# Patient Record
Sex: Male | Born: 1980
Health system: Southern US, Community
[De-identification: ages and names within clinical notes are randomized; demographics above are authoritative.]

## PROBLEM LIST (undated history)

## (undated) DIAGNOSIS — M545 Low back pain, unspecified: Secondary | ICD-10-CM

## (undated) HISTORY — PX: SPINE SURGERY: SHX786

## (undated) HISTORY — PX: BACK SURGERY: SHX140

---

## 2005-04-16 ENCOUNTER — Ambulatory Visit (HOSPITAL_COMMUNITY): Admission: RE | Admit: 2005-04-16 | Discharge: 2005-04-16 | Payer: Self-pay | Admitting: *Deleted

## 2015-04-18 ENCOUNTER — Encounter (HOSPITAL_COMMUNITY): Payer: Self-pay | Admitting: *Deleted

## 2015-04-18 ENCOUNTER — Emergency Department (HOSPITAL_COMMUNITY)
Admission: EM | Admit: 2015-04-18 | Discharge: 2015-04-18 | Disposition: A | Discharge status: Home / Self Care | Payer: 59 | Attending: Emergency Medicine | Admitting: Emergency Medicine

## 2015-04-18 ENCOUNTER — Emergency Department (HOSPITAL_COMMUNITY): Payer: 59

## 2015-04-18 DIAGNOSIS — G8929 Other chronic pain: Secondary | ICD-10-CM | POA: Diagnosis not present

## 2015-04-18 DIAGNOSIS — Y999 Unspecified external cause status: Secondary | ICD-10-CM | POA: Diagnosis not present

## 2015-04-18 DIAGNOSIS — Z9889 Other specified postprocedural states: Secondary | ICD-10-CM | POA: Diagnosis not present

## 2015-04-18 DIAGNOSIS — S3992XA Unspecified injury of lower back, initial encounter: Secondary | ICD-10-CM | POA: Diagnosis not present

## 2015-04-18 DIAGNOSIS — Y9389 Activity, other specified: Secondary | ICD-10-CM | POA: Insufficient documentation

## 2015-04-18 DIAGNOSIS — Y929 Unspecified place or not applicable: Secondary | ICD-10-CM | POA: Insufficient documentation

## 2015-04-18 DIAGNOSIS — X58XXXA Exposure to other specified factors, initial encounter: Secondary | ICD-10-CM | POA: Insufficient documentation

## 2015-04-18 DIAGNOSIS — M545 Low back pain, unspecified: Secondary | ICD-10-CM

## 2015-04-18 HISTORY — DX: Low back pain, unspecified: M54.50

## 2015-04-18 HISTORY — DX: Low back pain: M54.5

## 2015-04-18 MED ORDER — METHOCARBAMOL 500 MG PO TABS
1000.0000 mg | ORAL_TABLET | Freq: Once | ORAL | Status: AC
  Administered 2015-04-18: 1000 mg via ORAL
  Filled 2015-04-18: qty 2

## 2015-04-18 MED ORDER — OXYCODONE-ACETAMINOPHEN 5-325 MG PO TABS: ORAL_TABLET | ORAL | Status: DC

## 2015-04-18 MED ORDER — OXYCODONE-ACETAMINOPHEN 5-325 MG PO TABS
1.0000 | ORAL_TABLET | Freq: Once | ORAL | Status: AC
Start: 2015-04-18 — End: 2015-04-18
  Administered 2015-04-18: 1 via ORAL
  Filled 2015-04-18: qty 1

## 2015-04-18 MED ORDER — NAPROXEN 250 MG PO TABS: 250.0000 mg | ORAL_TABLET | Freq: Two times a day (BID) | ORAL | Status: DC | PRN

## 2015-04-18 MED ORDER — METHOCARBAMOL 500 MG PO TABS: 1000.0000 mg | ORAL_TABLET | Freq: Four times a day (QID) | ORAL | Status: DC | PRN

## 2015-04-18 MED ORDER — IBUPROFEN 200 MG PO TABS
400.0000 mg | ORAL_TABLET | Freq: Once | ORAL | Status: AC
  Administered 2015-04-18: 400 mg via ORAL
  Filled 2015-04-18: qty 2

## 2015-04-18 NOTE — ED Notes (Signed)
Bed: WA24 Expected date:  Expected time:  Means of arrival:  Comments: EMS- back pain 

## 2015-04-18 NOTE — Discharge Instructions (Signed)
°Emergency Department Resource Guide °1) Find a Doctor and Pay Out of Pocket °Although you won't have to find out who is covered by your insurance plan, it is a good idea to ask around and get recommendations. You will then need to call the office and see if the doctor you have chosen will accept you as a new patient and what types of options they offer for patients who are self-pay. Some doctors offer discounts or will set up payment plans for their patients who do not have insurance, but you will need to ask so you aren't surprised when you get to your appointment. ° °2) Contact Your Local Health Department °Not all health departments have doctors that can see patients for sick visits, but many do, so it is worth a call to see if yours does. If you don't know where your local health department is, you can check in your phone book. The CDC also has a tool to help you locate your state's health department, and many state websites also have listings of all of their local health departments. ° °3) Find a Walk-in Clinic °If your illness is not likely to be very severe or complicated, you may want to try a walk in clinic. These are popping up all over the country in pharmacies, drugstores, and shopping centers. They're usually staffed by nurse practitioners or physician assistants that have been trained to treat common illnesses and complaints. They're usually fairly quick and inexpensive. However, if you have serious medical issues or chronic medical problems, these are probably not your best option. ° °No Primary Care Doctor: °- Call [facility] at  832-8000 - they can help you locate a primary care doctor that  accepts your insurance, provides certain services, etc. °- Physician Referral Service- 1-800-533-3463 ° °Chronic Pain Problems: °Organization         Address  Phone   Notes  °[facility]  (336) 297-2271 Patients need to be referred by their primary care doctor.  ° °Medication  Assistance: °Organization         Address  Phone   Notes  °[place] [facility] [address] °[place], [place] [address] (336) 641-8030 --Must be a resident of [place] °-- Must have NO insurance coverage whatsoever (no Medicaid/ Medicare, etc.) °-- The pt. MUST have a primary care doctor that directs their care regularly and follows them in the community °  °[facility]  (866) 331-1348   °[employer]  (888) 892-1162   ° °Agencies that provide inexpensive medical care: °Organization         Address  Phone   Notes  °[facility]  (336) 832-8035   °[facility]    (336) 832-7272   °[facility] [address] °[place],  [address] (336) 832-4777   °[facility] [address], °[place] (336) 271-4999   °[facility]    (336) 373-0678   °[facility]    (336) 272-1050   °[facility] ° [address], [place] Phone:  (336) 832-4444, Fax:  (336) 832-4440 Hours of Operation:  9 am - 6 pm, M-F.  Also accepts Medicaid/Medicare and self-pay.  °[facility] ° [address], [place] Phone: (336) 832-3150, Fax: (336) 832-3151. Hours of Operation:  8:30 am - 5:30 pm, M-F.  Also accepts Medicaid and self-pay.  °[facility] [place] [address], [place] Phone: (336) 878-6027   °[facility] [address], [place], [place] (336)723-1848, Ext. 123 Mondays & Thursdays: 7-9 AM.  First 15 patients are seen on a first come, first serve basis. °  ° °Medicaid-accepting [place] Providers: ° °Organization         Address  Phone   Notes  °[facility] [address], [place] (336) 641-2100 Also accepts self-pay patients.  °[facility] [address], [place] ° (336) 856-9996   °[facility] [address], [place]  (336) 288-8857   °[facility] [address] [place] [address], [place] (336) 299-7000   °Veita Bland [address], [place]  ° (336) 373-1557 Only accepts [place] Access Medicaid patients after they have their name applied to their card.  ° °Self-Pay (no insurance) in [place]: ° °Organization         Address  Phone   Notes  °Sickle Cell Patients, [facility] [address], [place] (336) 832-1970   °[facility] [address], [place] (336) 832-4400   °[facility] ° [address], [place] (336) 992-4800   °[facility]/Dr. Osei-Bonsu ° [address] [place] [address], [place] or [address], [place] (336) 841-8500 Phone number for both [place] and [place] locations is the same.  °[facility] [address], [place] (336) 299-0000   °[facility] [address] [place] [address], [place] or [address] (336) 852-7530 °(336) 878-2260   °[facility] [address], [place] (336) 350-1642, phone; (336) 294-5005, fax Sees patients 1st and 3rd Saturday of every month.  Must not qualify for public or private insurance (i.e. Medicaid, Medicare, [place] Health Choice, Veterans' Benefits) • Household income should be no more than 200% of the poverty level •The clinic cannot treat you if you are pregnant or think you are pregnant • Sexually transmitted diseases are not treated at the clinic.  ° ° °Dental Care: °Organization         Address  Phone  Notes  °[place] [facility] [address], [place] (336) 641-6152 Accepts children up to age 21 who are enrolled in Medicaid or [place] Health Choice; pregnant women with a Medicaid card; and children who have applied for Medicaid or [place] Health Choice, but were declined, whose parents can pay a reduced fee at time of service.  °[place]  [facility] [place]  [address], [place] (336) 641-7733 Accepts children up to age 21 who are enrolled in Medicaid or [place] Health Choice; pregnant women with a Medicaid card; and children who have applied for Medicaid or [place] Health Choice, but were declined, whose parents can pay a reduced fee at time of service.  °[facility] ° [address],  (336) 641-4533 Patients are seen by appointment only. Walk-ins are not accepted. [facility] will see patients 18 years of age and older. °Monday - Tuesday (8am-5pm) °Most Wednesdays (8:30-5pm) °$30 per visit, cash only  °[facility] ° [address], [place] (336) 641-4533 Patients are seen by appointment only. Walk-ins are not accepted. [facility] will see patients 18 years of age and older. °One   Wednesday Evening (Monthly: Volunteer Based).  $30 per visit, cash only  °[facility]  (919) 537-3737 for adults; Children under age 4, call Graduate Pediatric Dentistry at (919) [phone]. Children aged 4-14, please call (919) 537-3737 to request a pediatric application. ° Dental services are provided in all areas of dental care including fillings, crowns and bridges, complete and partial dentures, implants, gum treatment, root canals, and extractions. Preventive care is also provided. Treatment is provided to both adults and children. °Patients are selected via a lottery and there is often a waiting list. °  °[facility] [address], °[place] ° (336) 763-8833 [email or web address] °  °[facility] [address], [place], [place] (336)723-1848, Ext. 123 Second and Fourth Thursday of each month, opens at 6:30 AM; Clinic ends at 9 AM.  Patients are seen on a first-come first-served basis, and a limited number are seen during each clinic.  ° °[facility] ° [address], [place], [place] (336) 723-7904    Eligibility Requirements °You must have lived in [place], [place], or [place] counties for at least the last three months. °  You cannot be eligible for state or federal sponsored healthcare insurance, including Veterans Administration, Medicaid, or Medicare. °  You generally cannot be eligible for healthcare insurance through your employer.  °  How to apply: °Eligibility screenings are held every Tuesday and Wednesday afternoon from 1:00 pm until 4:00 pm. You do not need an appointment for the interview!  °[facility] [address], [place], [place] 336-631-2330   °[facility]  336-342-8273   °[facility]  336-703-3100   °[facility]  336-570-6415   ° °Behavioral Health Resources in the Community: °Intensive Outpatient Programs °Organization         Address  Phone  Notes  °[facility] [address], [place], [place] 336-878-6098   °[facility] [address], [place], [place] 336-832-9800   °[facility] [address], [place], [place] ° 336-882-2125   °[facility] [address],  °[place], [place] 1-800-853-5163 or 336-641-4981   °Substance Abuse Resources °Organization         Address  Phone  Notes  °[facility]  336-882-2125   °[facility]  336-784-9470   °[facility]  336-285-9073   °[facility]  336-845-3988   °Residential & Outpatient Substance Abuse Program  1-800-659-3381   °Psychological Services °Organization         Address  Phone  Notes  °[facility]  336- 832-9600   °[facility]  336- 378-7881   °[facility] [address], [place] 1-800-853-5163 or 336-641-4981   ° °Mobile Crisis Teams °Organization         Address  Phone  Notes  °[facility], Mobile Crisis Care Unit  1-877-626-1772   °Assertive °[facility] ° [address]  [place], [place] 336-834-9664   °Sharon DeEsch [address] °[place] 336-554-5454   ° °Self-Help/Support Groups °Organization         Address  Phone             Notes  °[facility]  - variety of support groups  336- 373-1402 Call for more information  °Narcotics Anonymous (NA), [facility] [address], °[place]   2 meetings at this location  ° °  Residential Treatment Programs °Organization         Address  Phone  Notes  °[facility] [address],    °[place]  1-866-801-8205   °[facility] ° [address], [place], [place] 704-293-8524   °[facility] [address], [place] 336-845-3988 Admissions: 8am-3pm M-F  °[facility] [address]    °[place], [place] 336-841-1104   °[facility] [address], [place], [place] 336-379-7146   °[facility] [address]  °[place], [place] 336-285-9073   °[facility] - Intensive Outpatient [address], [place], [place] 336-852-3033   °[facility] [address]  °[place], [place] 1-877-615-2722 or 336-784-9470   °[facility] ([facility] [address] [place], [place] 336-227-7417 Accepts Medicaid  °[facility] [address]  °[place] 1-800-659-3381 Substance Abuse/Addiction Treatment  ° °[place] Behavioral Health Resources °Organization         Address  Phone  Notes  °[facility]  (888) 581-9988   °Julie Brannon, PhD [address] [place], [place]   (336) 349-5553 or (336) 951-0000   °[facility]   [address] °[place], [place] (336) 349-4454   °[facility] [address], [place], [place] (336) 342-8316 Insurance/Medicaid/sponsorship through [facility]  °[facility] [address]                                    [place], [place] (336) 342-8316 Therapy/tele-psych/case    °[facility] [address]  ° [place], [place] (336) 349-2233    °Dr. Arfeen  (336) 349-4544   °[facility] [place]  [employer] [place] [facility] 1) [address],  °2) [address], [place] °3)  [address], [place] (336) 349-3220 °(336) 342-7768 ° °(336) 342-8140   °[place] [facility] (336) 342-1394 or (336) 342-3537 (After Hours)    ° ° °Take the prescriptions as directed.  Apply moist heat or ice to the area(s) of discomfort, for 15 minutes at a time, several times per day for the next few days.  Do not fall asleep on a heating or ice pack.  Call your regular medical doctor today to schedule a follow up appointment in the next 2 days.  Return to the Emergency Department immediately if worsening. ° °

## 2015-04-18 NOTE — ED Notes (Signed)
Patient is alert and oriented x3.  He is complaining of lower back pain with worse pain on the left side. Patient states that he was lifting weights last night when he felt a pop in his lower back.  Patient has a  History of back surgery.  Currently he rates his pain 8 of 10.

## 2015-04-18 NOTE — ED Notes (Signed)
Pt reports slight relief with pain medication; "feels like it is starting to kick in; moving around is easier."

## 2015-04-18 NOTE — ED Notes (Signed)
MD at bedside. 

## 2015-04-18 NOTE — ED Provider Notes (Signed)
CSN: [id number]     Arrival date & time 04/18/15  0704 History   First MD Initiated Contact with Patient 04/18/15 [phone]     Chief Complaint  Patient presents with  . Back Pain      HPI  Pt was seen at 0725. Per pt, c/o gradual onset and persistence of constant acute flair of his chronic low back "pain" since last night.  Pt states he was lifting weights last night when he "felt a pop" in his lower back. Describes the pain as "aching," "sharp" and "shooting." Pain worsens with palpation of the area and body position changes. Denies incont/retention of bowel or bladder, no saddle anesthesia, no focal motor weakness, no tingling/numbness in extremities, no fevers, no direct injury, no abd pain.   The symptoms have been associated with no other complaints. The patient has a significant history of similar symptoms previously.    Past Medical History  Diagnosis Date  . Low back pain      Past Surgical History  Procedure Laterality Date  . Back surgery      History  Substance Use Topics  . Smoking status: Never Smoker   . Smokeless tobacco: Not on file  . Alcohol Use: Yes    Review of Systems ROS: Statement: All systems negative except as marked or noted in the HPI; Constitutional: Negative for fever and chills. ; ; Eyes: Negative for eye pain, redness and discharge. ; ; ENMT: Negative for ear pain, hoarseness, nasal congestion, sinus pressure and sore throat. ; ; Cardiovascular: Negative for chest pain, palpitations, diaphoresis, dyspnea and peripheral edema. ; ; Respiratory: Negative for cough, wheezing and stridor. ; ; Gastrointestinal: Negative for nausea, vomiting, diarrhea, abdominal pain, blood in stool, hematemesis, jaundice and rectal bleeding. . ; ; Genitourinary: Negative for dysuria, flank pain and hematuria. ; ; Musculoskeletal: +LBP. Negative for neck pain. Negative for swelling and direct trauma.; ; Skin: Negative for pruritus, rash, abrasions, blisters, bruising and skin  lesion.; ; Neuro: Negative for headache, lightheadedness and neck stiffness. Negative for weakness, altered level of consciousness , altered mental status, extremity weakness, paresthesias, involuntary movement, seizure and syncope.      Allergies  Review of patient's allergies indicates no known allergies.  Home Medications   Prior to Admission medications   Not on File   BP 149/90 mmHg  Pulse 102  Temp(Src) 97.9 F (36.6 C) (Oral)  Resp 20  Ht 5\' 11"  (1.803 m)  Wt 212 lb (96.163 kg)  BMI 29.58 kg/m2  SpO2 98% Physical Exam  0730: Physical examination:  Nursing notes reviewed; Vital signs and O2 SAT reviewed;  Constitutional: Well developed, Well nourished, Well hydrated, In no acute distress; Head:  Normocephalic, atraumatic; Eyes: EOMI, PERRL, No scleral icterus; ENMT: Mouth and pharynx normal, Mucous membranes moist; Neck: Supple, Full range of motion, No lymphadenopathy; Cardiovascular: Regular rate and rhythm, No gallop; Respiratory: Breath sounds clear & equal bilaterally, No wheezes.  Speaking full sentences with ease, Normal respiratory effort/excursion; Chest: Nontender, Movement normal; Abdomen: Soft, Nontender, Nondistended, Normal bowel sounds; Genitourinary: No CVA tenderness; Spine:  No midline CS, TS, LS tenderness. +TTP left > right lumbar paraspinal muscles..;; Extremities: Pulses normal, Pelvis stable. No tenderness, No edema, No calf edema or asymmetry.; Neuro: AA&Ox3, Major CN grossly intact.  Speech clear. No gross focal motor or sensory deficits in extremities. Strength 5/5 equal bilat UE's and LE's, including great toe dorsiflexion.  DTR 2/4 equal bilat UE's and LE's.  No gross sensory deficits.  Neg straight leg raises bilat. Climbs on and off stretcher easily by himself. Gait steady.; Skin: Color normal, Warm, Dry.   ED Course  Procedures     EKG Interpretation None      MDM  MDM Reviewed: previous chart, nursing note and vitals Reviewed previous:  MRI Interpretation: x-ray     Dg Lumbar Spine Complete 04/18/2015   CLINICAL DATA:  Felt a pop in lower back yesterday while lifting weights, increasing moderate to severe mid low back pain  EXAM: LUMBAR SPINE - COMPLETE 4+ VIEW  COMPARISON:  04/16/2005  FINDINGS: Five non-rib-bearing lumbar vertebra.  Prior posterior L4-L5 fusion and disc space narrowing.  Hardware appears intact without surrounding osseous lucency.  Osseous mineralization normal.  Vertebral body and disc space heights otherwise maintained.  No acute fracture, subluxation or bone destruction.  No spondylolysis.  SI joints symmetric.  IMPRESSION: Interval L4-L5 posterior fusion.  No acute abnormalities.   Electronically Signed   By: Lavonia Dana M.D.   On: 04/18/2015 08:01    0815:  Pt has been ambulatory with steady gait. States he feels better after meds and wants to go home now. Dx and testing d/w pt and friend.  Questions answered.  Verb understanding, agreeable to d/c home with outpt f/u.    Francine Graven, DO 04/19/15 1147

## 2016-08-26 DIAGNOSIS — H5213 Myopia, bilateral: Secondary | ICD-10-CM | POA: Diagnosis not present

## 2016-10-29 IMAGING — CR DG LUMBAR SPINE COMPLETE 4+V
5 series · 5 of 5 positions shown · non-contrast
Comparison: 04/16/2005

CLINICAL DATA: Felt a pop in lower back yesterday while lifting
weights, increasing moderate to severe mid low back pain

EXAM:
LUMBAR SPINE - COMPLETE 4+ VIEW

[t lumbar spine ap]
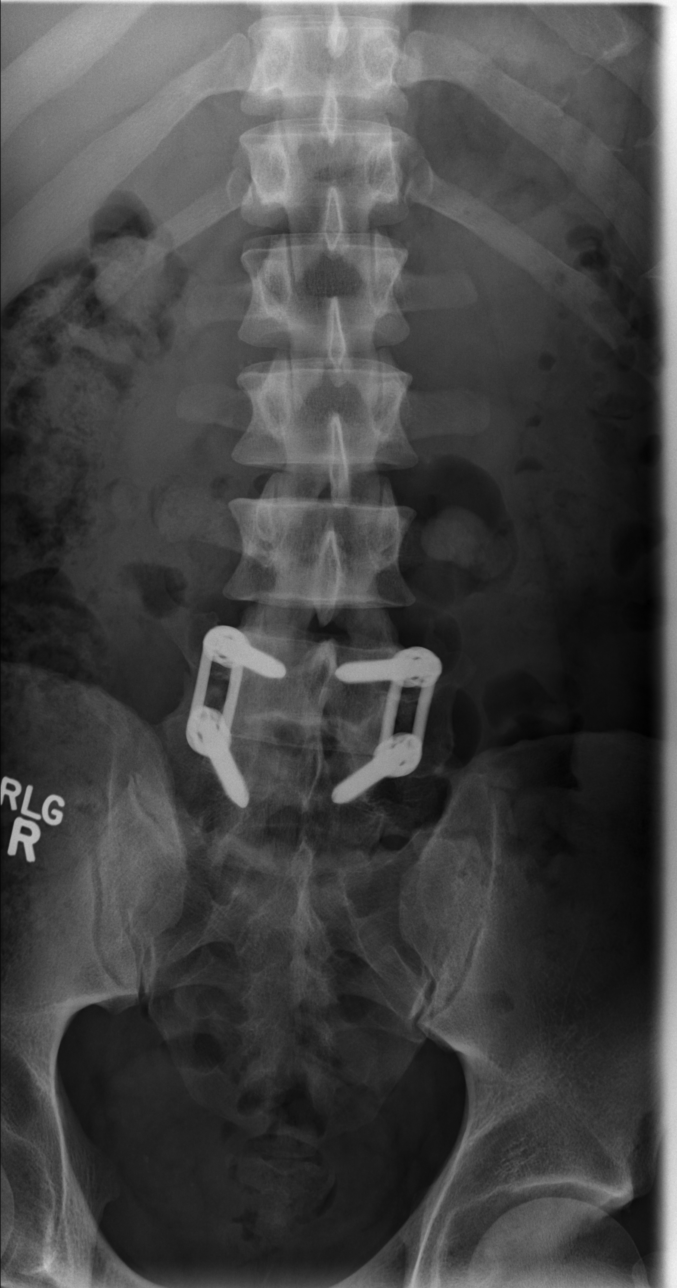

[t lumbar spine obl (1 of 2)]
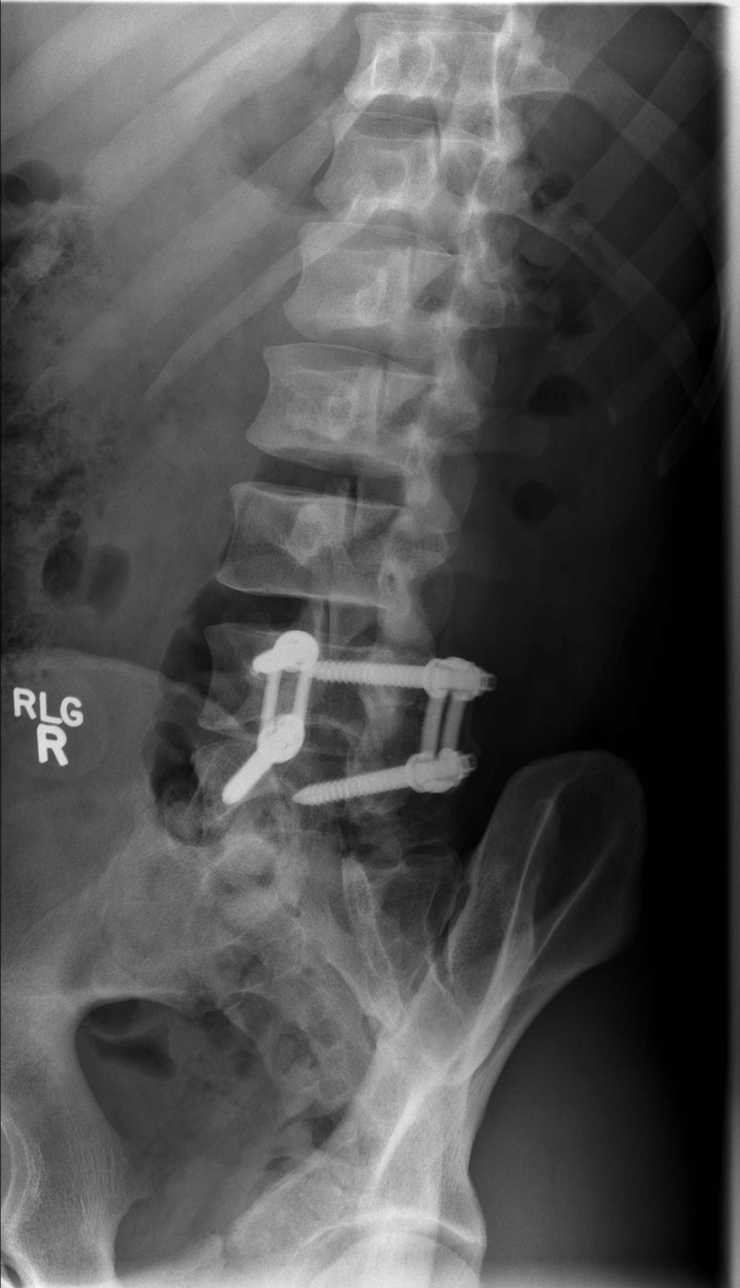

[t lumbar spine obl (2 of 2)]
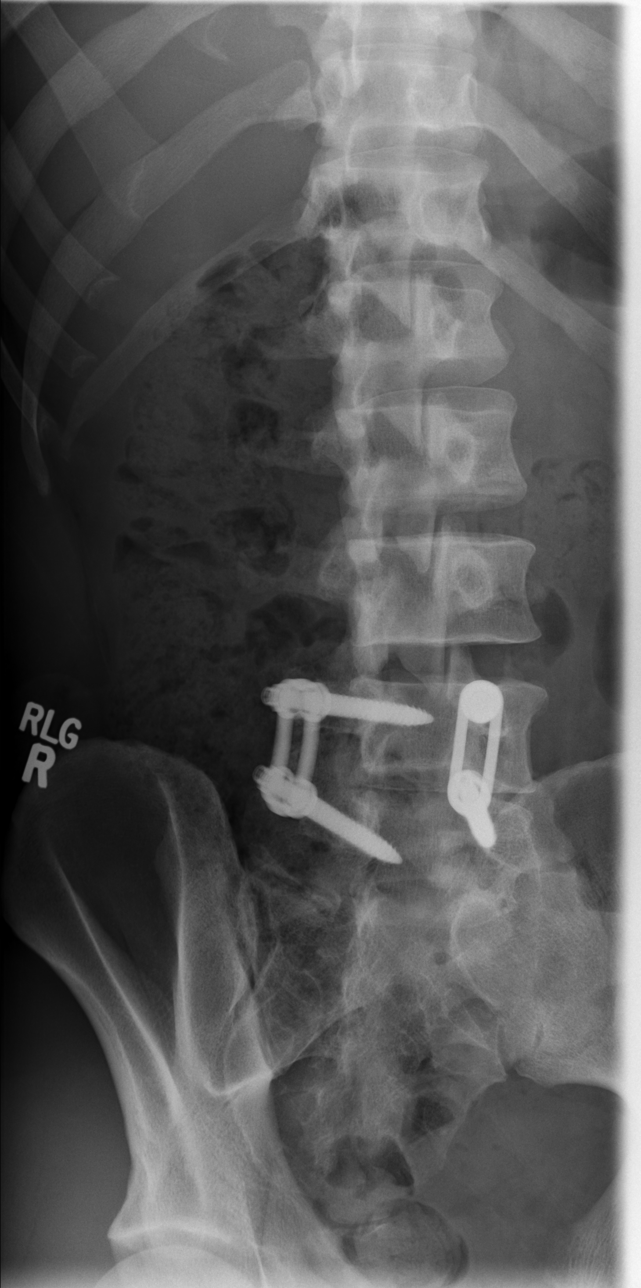

[t lumbar spine lat]
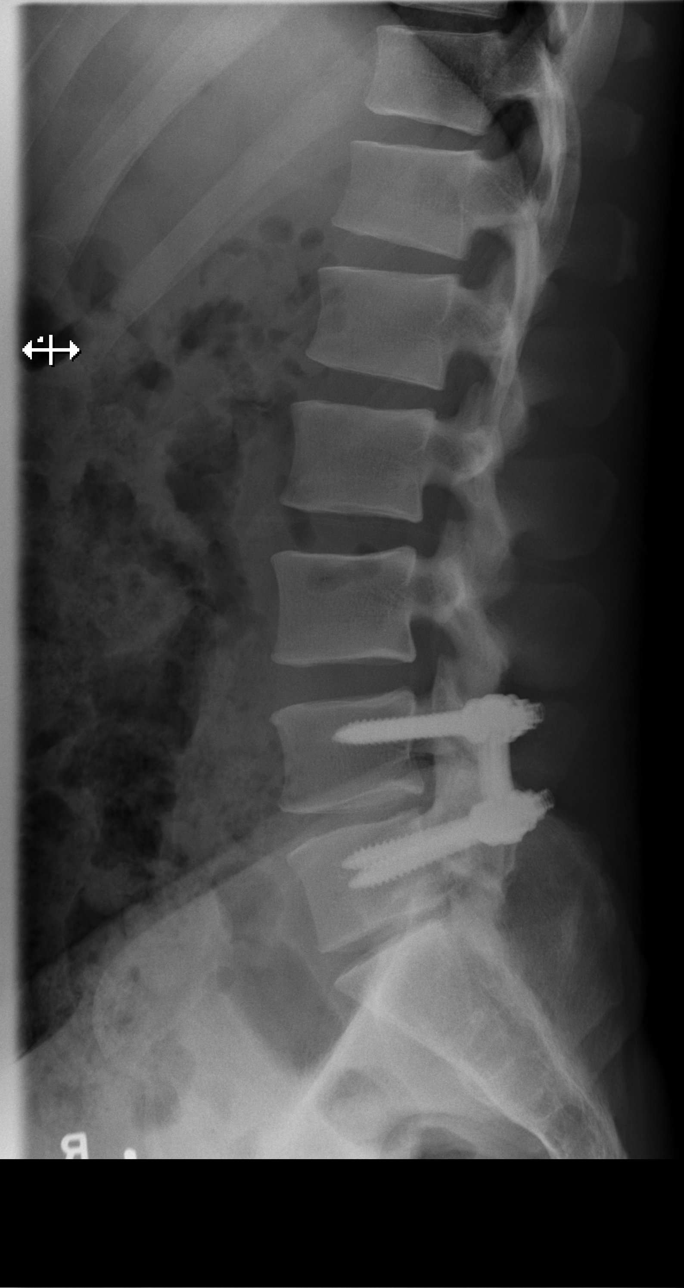

[t lumbar l-5 s-1 spot]
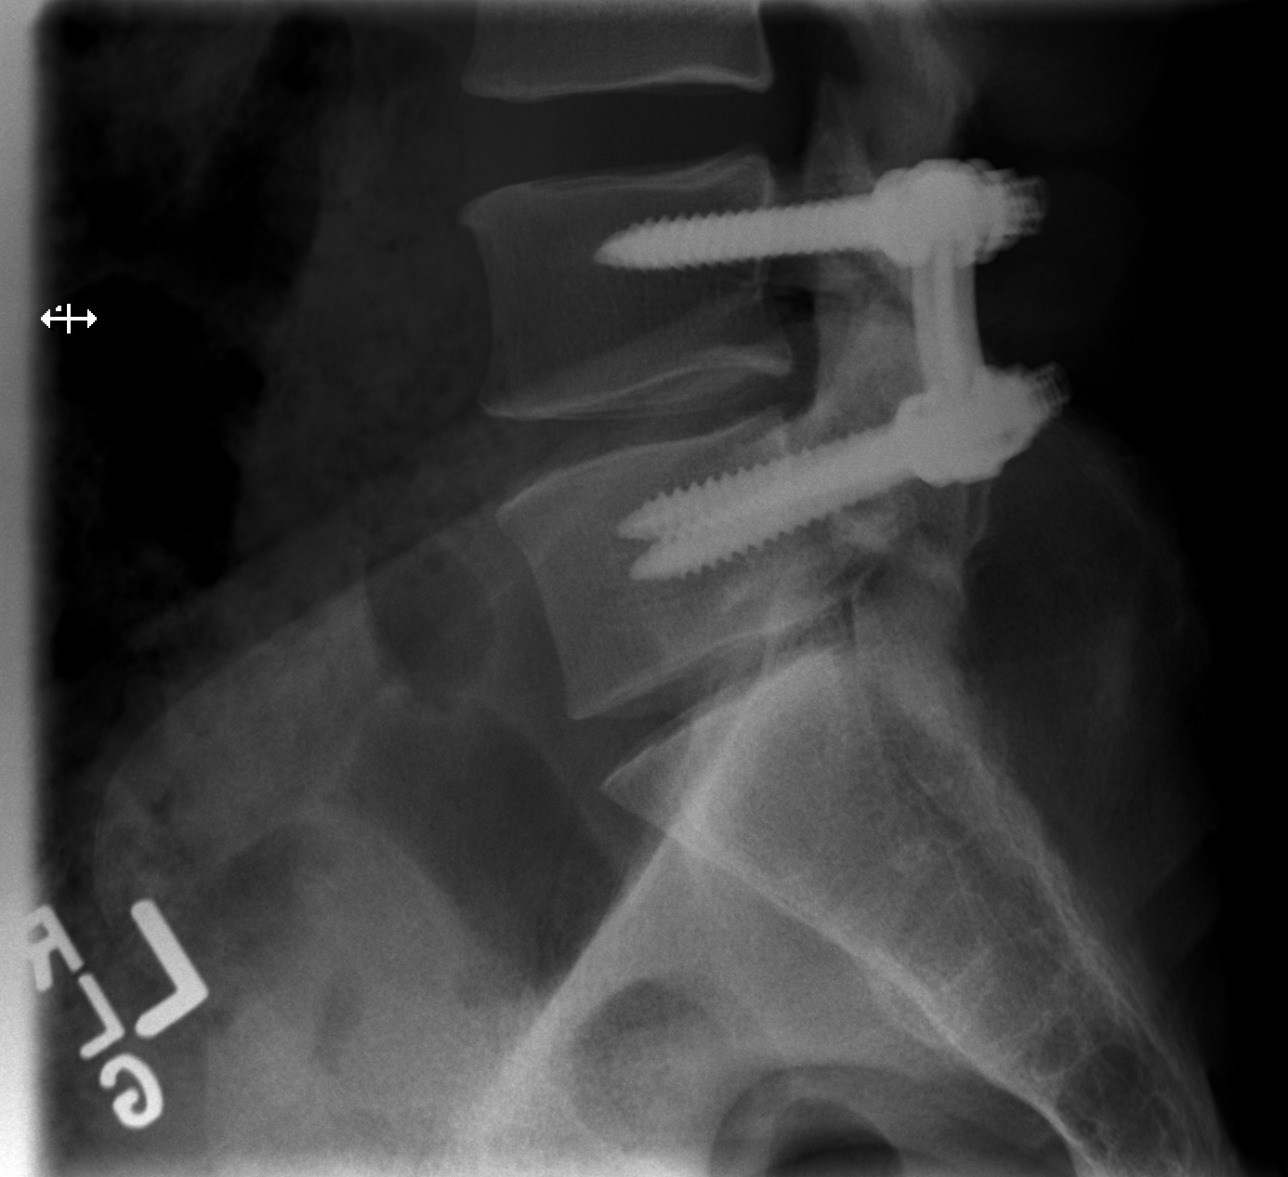

[5 of 5 positions shown; findings below may reference images not displayed]

FINDINGS: Five non-rib-bearing lumbar vertebra.

Prior posterior L4-L5 fusion and disc space narrowing.

Hardware appears intact without surrounding osseous lucency.

Osseous mineralization normal.

Vertebral body and disc space heights otherwise maintained.

No acute fracture, subluxation or bone destruction.

No spondylolysis.

SI joints symmetric.
IMPRESSION: Interval L4-L5 posterior fusion.

No acute abnormalities.

## 2018-02-23 DIAGNOSIS — D485 Neoplasm of uncertain behavior of skin: Secondary | ICD-10-CM | POA: Diagnosis not present

## 2018-02-23 DIAGNOSIS — D2272 Melanocytic nevi of left lower limb, including hip: Secondary | ICD-10-CM | POA: Diagnosis not present

## 2018-07-22 DIAGNOSIS — H5213 Myopia, bilateral: Secondary | ICD-10-CM | POA: Diagnosis not present

## 2019-01-08 DIAGNOSIS — J069 Acute upper respiratory infection, unspecified: Secondary | ICD-10-CM | POA: Diagnosis not present

## 2021-03-18 DIAGNOSIS — Z3009 Encounter for other general counseling and advice on contraception: Secondary | ICD-10-CM | POA: Diagnosis not present

## 2021-05-30 DIAGNOSIS — Z302 Encounter for sterilization: Secondary | ICD-10-CM | POA: Diagnosis not present

## 2021-08-08 ENCOUNTER — Ambulatory Visit (HOSPITAL_BASED_OUTPATIENT_CLINIC_OR_DEPARTMENT_OTHER): Payer: 59 | Admitting: Nurse Practitioner

## 2021-08-08 ENCOUNTER — Encounter (HOSPITAL_BASED_OUTPATIENT_CLINIC_OR_DEPARTMENT_OTHER): Payer: Self-pay | Admitting: Nurse Practitioner

## 2021-08-08 ENCOUNTER — Other Ambulatory Visit: Payer: Self-pay

## 2021-08-08 VITALS — BP 148/93 | HR 100 | Ht 71.0 in | Wt 193.8 lb

## 2021-08-08 DIAGNOSIS — Z1321 Encounter for screening for nutritional disorder: Secondary | ICD-10-CM

## 2021-08-08 DIAGNOSIS — Z13220 Encounter for screening for lipoid disorders: Secondary | ICD-10-CM | POA: Diagnosis not present

## 2021-08-08 DIAGNOSIS — Z130 Encounter for screening for diseases of the blood and blood-forming organs and certain disorders involving the immune mechanism: Secondary | ICD-10-CM | POA: Diagnosis not present

## 2021-08-08 DIAGNOSIS — Z1329 Encounter for screening for other suspected endocrine disorder: Secondary | ICD-10-CM | POA: Diagnosis not present

## 2021-08-08 DIAGNOSIS — Z0000 Encounter for general adult medical examination without abnormal findings: Secondary | ICD-10-CM | POA: Diagnosis not present

## 2021-08-08 DIAGNOSIS — Z13228 Encounter for screening for other metabolic disorders: Secondary | ICD-10-CM

## 2021-08-08 NOTE — Progress Notes (Signed)
Douglas Render, DNP, AGNP-c Primary Care & Sports Medicine [address]  [place], [place] [address] [phone]  New patient visit   Patient: Douglas Sanford   DOB: March 16, 1981   40 y.o. Male  MRN: [id number] Visit Date: 08/08/2021  Patient Care Team: Douglas Sanford, Douglas Pesa, NP as PCP - General (Nurse Practitioner)  Today's healthcare provider: Orma Render, NP   Chief Complaint  Patient presents with   [name] is a 40 y.o. male who presents today as a new patient to establish care.  HPI   Douglas Sanford reports no significant medical history and no concerns today.  He has not seen a PCP in several years or had labs.  He works full time in [place] at [employer] and enjoys his work.  He is married with two young children at home.   He has a history of spinal fusion at 20y and again at [place]. He reports that he has learned what he can and cannot do to help protect his spine and prevent pain.  He has no concerns today.   Past Medical History:  Diagnosis Date   Low back pain    Past Surgical History:  Procedure Laterality Date   BACK SURGERY     Family Status  Relation Name Status   Mother  Alive   Father  Alive   History reviewed. No pertinent family history. Social History   Socioeconomic History   Marital status: Married    Spouse name: Not on file   Number of children: 2   Years of education: Not on file   Highest education level: Not on file  Occupational History   Not on file  Tobacco Use   Smoking status: Never   Smokeless tobacco: Never  Vaping Use   Vaping Use: Never used  Substance and Sexual Activity   Alcohol use: Not Currently   Drug use: Never   Sexual activity: Not on file  Other Topics Concern   Not on file  Social History Narrative   60 month old son and 41 year old daughter.    Social Determinants of Health   Financial Resource Strain: Not on file  Food Insecurity: Not on file   Transportation Needs: Not on file  Physical Activity: Not on file  Stress: Not on file  Social Connections: Not on file   Outpatient Medications Prior to Visit  Medication Sig   [DISCONTINUED] ibuprofen (ADVIL,MOTRIN) 200 MG tablet Take 800 mg by mouth every 6 (six) hours as needed for fever, headache, mild pain, moderate pain or cramping. (Patient not taking: Reported on 08/08/2021)   [DISCONTINUED] methocarbamol (ROBAXIN) 500 MG tablet Take 2 tablets (1,000 mg total) by mouth 4 (four) times daily as needed for muscle spasms (muscle spasm/pain). (Patient not taking: Reported on 08/08/2021)   [DISCONTINUED] naproxen (NAPROSYN) 250 MG tablet Take 1 tablet (250 mg total) by mouth 2 (two) times daily as needed for mild pain or moderate pain (take with food). (Patient not taking: Reported on 08/08/2021)   [DISCONTINUED] oxyCODONE-acetaminophen (PERCOCET/ROXICET) 5-325 MG per tablet 1 or 2 tabs PO q6h prn pain (Patient not taking: Reported on 08/08/2021)   No facility-administered medications prior to visit.   No Known Allergies  Immunization History  Administered Date(s) Administered   Influenza-Unspecified 08/06/2021    Health Maintenance  Topic Date Due   TETANUS/TDAP  Never done   INFLUENZA VACCINE  Completed  Pneumococcal Vaccine 1-20 Years old  Aged Out   HPV VACCINES  Aged Out   Hepatitis C Screening  Discontinued   HIV Screening  Discontinued    Patient Care Team: Keliah Harned, Douglas Pesa, NP as PCP - General (Nurse Practitioner)  Review of Systems All review of systems negative except what is listed in the HPI    Objective    BP (!) 148/93   Pulse 100   Ht _0  (1.803 m)   Wt 193 lb 12.8 oz (87.9 kg)   SpO2 100%   BMI 27.03 kg/m  Physical Exam Vitals and nursing note reviewed.  Constitutional:      General: He is not in acute distress.    Appearance: Normal appearance.  HENT:     Head: Normocephalic and atraumatic.     Right Ear: Hearing, tympanic membrane, ear  canal and external ear normal.     Left Ear: Hearing, tympanic membrane, ear canal and external ear normal.     Nose: Nose normal.     Right Sinus: No maxillary sinus tenderness or frontal sinus tenderness.     Left Sinus: No maxillary sinus tenderness or frontal sinus tenderness.     Mouth/Throat:     Lips: Pink.     Mouth: Mucous membranes are moist.     Pharynx: Oropharynx is clear.  Eyes:     General: Lids are normal. Vision grossly intact.     Extraocular Movements: Extraocular movements intact.     Conjunctiva/sclera: Conjunctivae normal.     Pupils: Pupils are equal, round, and reactive to light.     Funduscopic exam:    Right eye: No hemorrhage. Red reflex present.        Left eye: No hemorrhage. Red reflex present.    Visual Fields: Right eye visual fields normal and left eye visual fields normal.  Neck:     Thyroid: No thyromegaly.     Vascular: No carotid bruit or JVD.  Cardiovascular:     Rate and Rhythm: Normal rate and regular rhythm.     Chest Wall: PMI is not displaced.     Pulses: Normal pulses.          Dorsalis pedis pulses are 2+ on the right side and 2+ on the left side.       Posterior tibial pulses are 2+ on the right side and 2+ on the left side.     Heart sounds: Normal heart sounds. No murmur heard. Pulmonary:     Effort: Pulmonary effort is normal. No respiratory distress.     Breath sounds: Normal breath sounds.  Chest:  Breasts:    Breasts are symmetrical.  Abdominal:     General: Bowel sounds are normal. There is no distension or abdominal bruit.     Palpations: Abdomen is soft. There is no hepatomegaly, splenomegaly or mass.     Tenderness: There is no abdominal tenderness. There is no right CVA tenderness, left CVA tenderness, guarding or rebound.  Musculoskeletal:        General: Normal range of motion.     Cervical back: Full passive range of motion without pain and neck supple. No tenderness. No spinous process tenderness or muscular  tenderness.     Right lower leg: No edema.     Left lower leg: No edema.  Feet:     Right foot:     Toenail Condition: Right toenails are normal.     Left foot:     Toenail  Condition: Left toenails are normal.  Lymphadenopathy:     Cervical: No cervical adenopathy.     Upper Body:     Right upper body: No supraclavicular adenopathy.     Left upper body: No supraclavicular adenopathy.  Skin:    General: Skin is warm and dry.     Capillary Refill: Capillary refill takes less than 2 seconds.     Nails: There is no clubbing.  Neurological:     General: No focal deficit present.     Mental Status: He is alert and oriented to person, place, and time.     Cranial Nerves: No cranial nerve deficit.     Sensory: Sensation is intact. No sensory deficit.     Motor: Motor function is intact. No weakness.     Coordination: Coordination is intact. Coordination normal.     Gait: Gait is intact. Gait normal.  Psychiatric:        Attention and Perception: Attention normal.        Mood and Affect: Mood normal.        Speech: Speech normal.        Behavior: Behavior normal. Behavior is cooperative.        Thought Content: Thought content normal.        Cognition and Memory: Cognition and memory normal.        Judgment: Judgment normal.     Depression Screen PHQ 2/9 Scores 08/08/2021  PHQ - 2 Score 0  PHQ- 9 Score 0   Results for orders placed or performed in visit on 08/08/21  CBC With Differential  Result Value Ref Range   WBC 7.2 3.4 - 10.8 x10E3/uL   RBC 5.36 4.14 - 5.80 x10E6/uL   Hemoglobin 16.2 13.0 - 17.7 g/dL   Hematocrit 46.5 37.5 - 51.0 %   MCV 87 79 - 97 fL   MCH 30.2 26.6 - 33.0 pg   MCHC 34.8 31.5 - 35.7 g/dL   RDW 12.1 11.6 - 15.4 %   Neutrophils 70 Not Estab. %   Lymphs 21 Not Estab. %   Monocytes 7 Not Estab. %   Eos 1 Not Estab. %   Basos 1 Not Estab. %   Neutrophils Absolute 5.0 1.4 - 7.0 x10E3/uL   Lymphocytes Absolute 1.5 0.7 - 3.1 x10E3/uL   Monocytes  Absolute 0.5 0.1 - 0.9 x10E3/uL   EOS (ABSOLUTE) 0.1 0.0 - 0.4 x10E3/uL   Basophils Absolute 0.1 0.0 - 0.2 x10E3/uL   Immature Granulocytes 0 Not Estab. %   Immature Grans (Abs) 0.0 0.0 - 0.1 x10E3/uL  Comprehensive metabolic panel  Result Value Ref Range   Glucose 101 (H) 70 - 99 mg/dL   BUN 14 6 - 20 mg/dL   Creatinine, Ser 0.93 0.76 - 1.27 mg/dL   eGFR 108 >59 mL/min/1.73   BUN/Creatinine Ratio 15 9 - 20   Sodium 140 134 - 144 mmol/L   Potassium 4.4 3.5 - 5.2 mmol/L   Chloride 101 96 - 106 mmol/L   CO2 24 20 - 29 mmol/L   Calcium 10.0 8.7 - 10.2 mg/dL   Total Protein 7.4 6.0 - 8.5 g/dL   Albumin 5.0 4.0 - 5.0 g/dL   Globulin, Total 2.4 1.5 - 4.5 g/dL   Albumin/Globulin Ratio 2.1 1.2 - 2.2   Bilirubin Total 0.3 0.0 - 1.2 mg/dL   Alkaline Phosphatase 63 44 - 121 IU/L   AST 17 0 - 40 IU/L   ALT 19 0 - 44 IU/L  Lipid panel  Result Value Ref Range   Cholesterol, Total 170 100 - 199 mg/dL   Triglycerides 75 0 - 149 mg/dL   HDL 68 >39 mg/dL   VLDL Cholesterol Cal 14 5 - 40 mg/dL   LDL Chol Calc (NIH) 88 0 - 99 mg/dL   Chol/HDL Ratio 2.5 0.0 - 5.0 ratio  Hemoglobin A1c  Result Value Ref Range   Hgb A1c MFr Bld 5.2 4.8 - 5.6 %   Est. average glucose Bld gHb Est-mCnc 103 mg/dL    Assessment & Plan      Problem List Items Addressed This Visit     Screening for endocrine, nutritional, metabolic and immunity disorder   Relevant Orders   Comprehensive metabolic panel (Completed)   Lipid panel (Completed)   Hemoglobin A1c (Completed)   Screening for lipid disorders   Relevant Orders   Lipid panel (Completed)   Screening for deficiency anemia - Primary   Relevant Orders   CBC With Differential (Completed)   Encounter for annual physical exam    CPE today for new patient.  No recent medical history available for review.  UTD on flu vaccine.  Unclear on tetanus- will check on that.  Plan to get labs today.  F/U in 1 year or sooner if needed based on labs         Return in about 1 year (around 08/08/2022) for CPE today- CPE in 1 year.      Nakiah Osgood, Douglas Pesa, NP, DNP, AGNP-C Primary Care & Sports Medicine at [facility]

## 2021-08-08 NOTE — Patient Instructions (Signed)
Thank you for choosing [facility] for your Primary Care needs. I am excited for the opportunity to partner with you to meet your health care goals. It was a pleasure meeting you today!  Recommendations from today's visit: We will let you know if you labs show any concerning findings.  If you have any needs, please let us know.   Information on diet, exercise, and health maintenance recommendations are listed below. This is information to help you be sure you are on track for optimal health and monitoring.   Please look over this and let us know if you have any questions or if you have completed any of the health maintenance outside of [facility] [place] so that we can be sure your records are up to date.  ___________________________________________________________ About Me: I am an Adult-Geriatric Nurse Practitioner with a background in caring for patients for more than 20 years with a strong intensive care background. I provide primary care and sports medicine services to patients age 38 and older within this office. My education had a strong focus on caring for the older adult population, which I am passionate about. I am also the director of the APP Fellowship with [facility].   My desire is to provide you with the best service through preventive medicine and supportive care. I consider you a part of the medical team and value your input. I work diligently to ensure that you are heard and your needs are met in a safe and effective manner. I want you to feel comfortable with me as your provider and want you to know that your health concerns are important to me.  For your information, our office hours are: Monday, Tuesday, and Thursday 8:00 AM - 5:00 PM Wednesday and Friday 8:00 AM - 12:00 PM.   In my time away from the office I am teaching new APP's within the system and am unavailable, but my partner, Dr. Burnard Bunting is in the office for emergent needs.   If you have  questions or concerns, please call our office at [phone] or send [place] a MyChart message and we will respond as quickly as possible.  ____________________________________________________________ MyChart:  For all urgent or time sensitive needs we ask that you please call the office to avoid delays. Our number is [phone]. MyChart is not constantly monitored and due to the large volume of messages a day, replies may take up to 72 business hours.  MyChart Policy: MyChart allows for you to see your visit notes, after visit summary, provider recommendations, lab and tests results, make an appointment, request refills, and contact your provider or the office for non-urgent questions or concerns. Providers are seeing patients during normal business hours and do not have built in time to review MyChart messages.  We ask that you allow a minimum of 3 business days for responses to [employer]. For this reason, please do not send urgent requests through [place]. Please call the office at [phone]. New and ongoing conditions may require a visit. We have virtual and in person visit available for your convenience.  Complex MyChart concerns may require a visit. Your provider may request you schedule a virtual or in person visit to ensure we are providing the best care possible. MyChart messages sent after 11:00 AM on Friday will not be received by the provider until Monday morning.    Lab and Test Results: You will receive your lab and test results on MyChart as soon as they  are completed and results have been sent by the lab or testing facility. Due to this service, you will receive your results BEFORE your provider.  I review lab and tests results each morning prior to seeing patients. Some results require collaboration with other providers to ensure you are receiving the most appropriate care. For this reason, we ask that you please allow a minimum of 3-5 business days from the time the ALL  results have been received for your provider to receive and review lab and test results and contact you about these.  Most lab and test result comments from the provider will be sent through [place]. Your provider may recommend changes to the plan of care, follow-up visits, repeat testing, ask questions, or request an office visit to discuss these results. You may reply directly to this message or call the office at [phone] to provide information for the provider or set up an appointment. In some instances, you will be called with test results and recommendations. Please let us know if this is preferred and we will make note of this in your chart to provide this for you.    If you have not heard a response to your lab or test results in 5 business days from all results returning to [place], please call the office to let us know. We ask that you please avoid calling prior to this time unless there is an emergent concern. Due to high call volumes, this can delay the resulting process.  After Hours: For all non-emergency after hours needs, please call the office at [phone] and select the option to reach the on-call provider service. On-call services are shared between multiple [place] offices and therefore it will not be possible to speak directly with your provider. On-call providers may provide medical advice and recommendations, but are unable to provide refills for maintenance medications.  For all emergency or urgent medical needs after normal business hours, we recommend that you seek care at the closest Urgent Care or Emergency Department to ensure appropriate treatment in a timely manner.  [facility] has a 24 hour emergency room located on the ground floor for your convenience.   Urgent Concerns During the Business Day Providers are seeing patients from 8AM to [place] with a busy schedule and are most often not able to respond to non-urgent calls until the end of the  day or the next business day. If you should have URGENT concerns during the day, please call and speak to the nurse or schedule a same day appointment so that we can address your concern without delay.   Thank you, again, for choosing me as your health care partner. I appreciate your trust and look forward to learning more about you.   Worthy Keeler, DNP, AGNP-c ___________________________________________________________  Health Maintenance Recommendations Screening Testing Mammogram Every 1 -2 years based on history and risk factors Starting at age 72 Pap Smear Ages 21-39 every 3 years Ages 57-65 every 5 years with HPV testing More frequent testing may be required based on results and history Colon Cancer Screening Every 1-10 years based on test performed, risk factors, and history Starting at age 16 Bone Density Screening Every 2-10 years based on history Starting at age 41 for women Recommendations for men differ based on medication usage, history, and risk factors AAA Screening One time ultrasound Men 35-91 years old who have every smoked Lung Cancer Screening Low Dose Lung CT every 12 months Age 95-80 years with a 10  pack-year smoking history who still smoke or who have quit within the last 15 years  Screening Labs Routine  Labs: Complete Blood Count (CBC), Complete Metabolic Panel (CMP), Cholesterol (Lipid Panel) Every 6-12 months based on history and medications May be recommended more frequently based on current conditions or previous results Hemoglobin A1c Lab Every 3-12 months based on history and previous results Starting at age 69 or earlier with diagnosis of diabetes, high cholesterol, BMI >26, and/or risk factors Frequent monitoring for patients with diabetes to ensure blood sugar control Thyroid Panel (TSH w/ T3 & T4) Every 6 months based on history, symptoms, and risk factors May be repeated more often if on medication HIV One time testing for all patients  39 and older May be repeated more frequently for patients with increased risk factors or exposure Hepatitis C One time testing for all patients 55 and older May be repeated more frequently for patients with increased risk factors or exposure Gonorrhea, Chlamydia Every 12 months for all sexually active persons 13-24 years Additional monitoring may be recommended for those who are considered high risk or who have symptoms PSA Men 3-67 years old with risk factors Additional screening may be recommended from age 67-69 based on risk factors, symptoms, and history  Vaccine Recommendations Tetanus Booster All adults every 10 years Flu Vaccine All patients 6 months and older every year COVID Vaccine All patients 12 years and older Initial dosing with booster May recommend additional booster based on age and health history HPV Vaccine 2 doses all patients age 55-26 Dosing may be considered for patients over 26 Shingles Vaccine (Shingrix) 2 doses all adults 18 years and older Pneumonia (Pneumovax 23) All adults 50 years and older May recommend earlier dosing based on health history Pneumonia (Prevnar 110) All adults 3 years and older Dosed 1 year after Pneumovax 23  Additional Screening, Testing, and Vaccinations may be recommended on an individualized basis based on family history, health history, risk factors, and/or exposure.  __________________________________________________________  Diet Recommendations for All Patients  I recommend that all patients maintain a diet low in saturated fats, carbohydrates, and cholesterol. While this can be challenging at first, it is not impossible and small changes can make big differences.  Things to try: Decreasing the amount of soda, sweet tea, and/or juice to one or less per day and replace with water While water is always the first choice, if you do not like water you may consider adding a water additive without sugar to improve the  taste other sugar free drinks Replace potatoes with a brightly colored vegetable at dinner Use healthy oils, such as canola oil or olive oil, instead of butter or hard margarine Limit your bread intake to two pieces or less a day Replace regular pasta with low carb pasta options Bake, broil, or grill foods instead of frying Monitor portion sizes  Eat smaller, more frequent meals throughout the day instead of large meals  An important thing to remember is, if you love foods that are not great for your health, you don't have to give them up completely. Instead, allow these foods to be a reward when you have done well. Allowing yourself to still have special treats every once in a while is a nice way to tell yourself thank you for working hard to keep yourself healthy.   Also remember that every day is a new day. If you have a bad day and "fall off the wagon", you can still climb right back up  and keep moving along on your journey!  We have resources available to help you!  Some websites that may be helpful include: www.http://carter.biz/  Www.VeryWellFit.com _____________________________________________________________  Activity Recommendations for All Patients  I recommend that all adults get at least 20 minutes of moderate physical activity that elevates your heart rate at least 5 days out of the week.  Some examples include: Walking or jogging at a pace that allows you to carry on a conversation Cycling (stationary bike or outdoors) Water aerobics Yoga Weight lifting Dancing If physical limitations prevent you from putting stress on your joints, exercise in a pool or seated in a chair are excellent options.  Do determine your MAXIMUM heart rate for activity: YOUR AGE - 220 = MAX HeartRate   Remember! Do not push yourself too hard.  Start slowly and build up your pace, speed, weight, time in exercise, etc.  Allow your body to rest between exercise and get good sleep. You will need more  water than normal when you are exerting yourself. Do not wait until you are thirsty to drink. Drink with a purpose of getting in at least 8, 8 ounce glasses of water a day plus more depending on how much you exercise and sweat.    If you begin to develop dizziness, chest pain, abdominal pain, jaw pain, shortness of breath, headache, vision changes, lightheadedness, or other concerning symptoms, stop the activity and allow your body to rest. If your symptoms are severe, seek emergency evaluation immediately. If your symptoms are concerning, but not severe, please let us know so that we can recommend further evaluation.

## 2021-08-09 DIAGNOSIS — Z0000 Encounter for general adult medical examination without abnormal findings: Secondary | ICD-10-CM | POA: Insufficient documentation

## 2021-08-09 DIAGNOSIS — Z13220 Encounter for screening for lipoid disorders: Secondary | ICD-10-CM | POA: Insufficient documentation

## 2021-08-09 DIAGNOSIS — Z1321 Encounter for screening for nutritional disorder: Secondary | ICD-10-CM | POA: Insufficient documentation

## 2021-08-09 DIAGNOSIS — Z130 Encounter for screening for diseases of the blood and blood-forming organs and certain disorders involving the immune mechanism: Secondary | ICD-10-CM | POA: Insufficient documentation

## 2021-08-09 LAB — LIPID PANEL
Chol/HDL Ratio: 2.5 ratio (ref 0.0–5.0)
Cholesterol, Total: 170 mg/dL (ref 100–199)
HDL: 68 mg/dL (ref >39)
LDL Chol Calc (NIH): 88 mg/dL (ref 0–99)
Triglycerides: 75 mg/dL (ref 0–149)
VLDL Cholesterol Cal: 14 mg/dL (ref 5–40)

## 2021-08-09 LAB — CBC WITH DIFFERENTIAL
Basophils Absolute: 0.1 10*3/uL (ref 0.0–0.2)
Basos: 1 %
EOS (ABSOLUTE): 0.1 10*3/uL (ref 0.0–0.4)
Eos: 1 %
Hematocrit: 46.5 % (ref 37.5–51.0)
Hemoglobin: 16.2 g/dL (ref 13.0–17.7)
Immature Grans (Abs): 0 10*3/uL (ref 0.0–0.1)
Immature Granulocytes: 0 %
Lymphocytes Absolute: 1.5 10*3/uL (ref 0.7–3.1)
Lymphs: 21 %
MCH: 30.2 pg (ref 26.6–33.0)
MCHC: 34.8 g/dL (ref 31.5–35.7)
MCV: 87 fL (ref 79–97)
Monocytes Absolute: 0.5 10*3/uL (ref 0.1–0.9)
Monocytes: 7 %
Neutrophils Absolute: 5 10*3/uL (ref 1.4–7.0)
Neutrophils: 70 %
RBC: 5.36 x10E6/uL (ref 4.14–5.80)
RDW: 12.1 % (ref 11.6–15.4)
WBC: 7.2 10*3/uL (ref 3.4–10.8)

## 2021-08-09 LAB — COMPREHENSIVE METABOLIC PANEL
ALT: 19 IU/L (ref 0–44)
AST: 17 IU/L (ref 0–40)
Albumin/Globulin Ratio: 2.1 (ref 1.2–2.2)
Albumin: 5 g/dL (ref 4.0–5.0)
Alkaline Phosphatase: 63 IU/L (ref 44–121)
BUN/Creatinine Ratio: 15 (ref 9–20)
BUN: 14 mg/dL (ref 6–20)
Bilirubin Total: 0.3 mg/dL (ref 0.0–1.2)
CO2: 24 mmol/L (ref 20–29)
Calcium: 10 mg/dL (ref 8.7–10.2)
Chloride: 101 mmol/L (ref 96–106)
Creatinine, Ser: 0.93 mg/dL (ref 0.76–1.27)
Globulin, Total: 2.4 g/dL (ref 1.5–4.5)
Glucose: 101 mg/dL — ABNORMAL HIGH (ref 70–99)
Potassium: 4.4 mmol/L (ref 3.5–5.2)
Sodium: 140 mmol/L (ref 134–144)
Total Protein: 7.4 g/dL (ref 6.0–8.5)
eGFR: 108 mL/min/{1.73_m2} (ref >59)

## 2021-08-09 LAB — HEMOGLOBIN A1C
Est. average glucose Bld gHb Est-mCnc: 103 mg/dL
Hgb A1c MFr Bld: 5.2 % (ref 4.8–5.6)

## 2021-08-09 NOTE — Assessment & Plan Note (Signed)
CPE today for new patient.  No recent medical history available for review.  UTD on flu vaccine.  Unclear on tetanus- will check on that.  Plan to get labs today.  F/U in 1 year or sooner if needed based on labs

## 2021-08-18 ENCOUNTER — Encounter (HOSPITAL_BASED_OUTPATIENT_CLINIC_OR_DEPARTMENT_OTHER): Payer: Self-pay | Admitting: Nurse Practitioner

## 2021-08-18 ENCOUNTER — Telehealth (INDEPENDENT_AMBULATORY_CARE_PROVIDER_SITE_OTHER): Payer: 59 | Admitting: Nurse Practitioner

## 2021-08-18 DIAGNOSIS — R051 Acute cough: Secondary | ICD-10-CM | POA: Diagnosis not present

## 2021-08-18 MED ORDER — HYDROCODONE BIT-HOMATROP MBR 5-1.5 MG/5ML PO SOLN: 5.0000 mL | Freq: Three times a day (TID) | ORAL | 0 refills | Status: DC | PRN

## 2021-08-18 MED ORDER — AZITHROMYCIN 250 MG PO TABS: ORAL_TABLET | ORAL | 0 refills | Status: AC

## 2021-08-18 NOTE — Assessment & Plan Note (Signed)
Cough and congestion with initial improvement of symptoms then return of symptoms after several days.  Will begin treatment with abx today for suspected secondary bacterial infection.  Will send hycodan for night time cough given the interruption of sleep.  Patient will f/u if symptoms worsen or fail to improve.

## 2021-08-18 NOTE — Patient Instructions (Signed)
I have sent in a Z-pack (azithromycin) for you. You will take 2 tabs today and 1 tab until the prescription is gone (total of 5 days).   I have also sent in night time cough medicine that should relieve the cough and help you (and your wife) rest a little better.  If you are no better by the end of the week or if this gets any worse, please let me know.

## 2021-08-18 NOTE — Progress Notes (Signed)
Virtual Visit Encounter  mychart visit.   I connected with  Douglas Sanford on 08/18/21 at 10:10 AM EST by secure audio and/or video enabled telemedicine application. I verified that I am speaking with the correct person using two identifiers.   I introduced myself as a Designer, jewellery with the practice. The limitations of evaluation and management by telemedicine discussed with the patient and the availability of in person appointments. The patient expressed verbal understanding and consent to proceed.  Participating parties in this visit include: Myself and patient  The patient is: Patient Location: Home I am: Provider Location: Office/Clinic Subjective:    CC and HPI: Douglas Sanford is a 40 y.o. year old male presenting for new evaluation and treatment of cough, congestion that has been ongoing for a few weeks. He reports both kids brought home a cold a few weeks ago. Initially this was improving, but late last week his symptoms started to get worse. He has been coughing - mostly at night with increased mucous production and coughing spells are keeping him awake. He denies fever, chills, body aches.  He reports the cough is the worst symptom. He has no wheezing or shortness of breath.   Past medical history, Surgical history, Family history not pertinant except as noted below, Social history, Allergies, and medications have been entered into the medical record, reviewed, and corrections made.   Review of Systems:  All review of systems negative except what is listed in the HPI  Objective:    Alert and oriented x 4 Speaking in clear sentences with no shortness of breath. Intermittent cough present, congestion audible.  No distress.  Impression and Recommendations:    Problem List Items Addressed This Visit     Acute cough - Primary    Cough and congestion with initial improvement of symptoms then return of symptoms after several days.  Will begin treatment with abx today for  suspected secondary bacterial infection.  Will send hycodan for night time cough given the interruption of sleep.  Patient will f/u if symptoms worsen or fail to improve.       Relevant Medications   azithromycin (ZITHROMAX) 250 MG tablet   HYDROcodone bit-homatropine (HYCODAN) 5-1.5 MG/5ML syrup    orders and follow up as documented in EMR I discussed the assessment and treatment plan with the patient. The patient was provided an opportunity to ask questions and all were answered. The patient agreed with the plan and demonstrated an understanding of the instructions.   The patient was advised to call back or seek an in-person evaluation if the symptoms worsen or if the condition fails to improve as anticipated.  Follow-Up: prn  I provided 20 minutes of non-face-to-face interaction with this non face-to-face encounter including intake, same-day documentation, and chart review.   Orma Render, NP , DNP, AGNP-c [place] at [facility] [phone] (fax)

## 2021-08-18 NOTE — Telephone Encounter (Signed)
Called patient and schedule virtual visit with pcp

## 2021-09-29 DIAGNOSIS — E291 Testicular hypofunction: Secondary | ICD-10-CM | POA: Diagnosis not present

## 2021-09-29 DIAGNOSIS — R635 Abnormal weight gain: Secondary | ICD-10-CM | POA: Diagnosis not present

## 2021-12-23 ENCOUNTER — Encounter (HOSPITAL_BASED_OUTPATIENT_CLINIC_OR_DEPARTMENT_OTHER): Payer: Self-pay | Admitting: Nurse Practitioner

## 2021-12-23 ENCOUNTER — Telehealth (INDEPENDENT_AMBULATORY_CARE_PROVIDER_SITE_OTHER): Payer: 59 | Admitting: Nurse Practitioner

## 2021-12-23 ENCOUNTER — Other Ambulatory Visit: Payer: Self-pay

## 2021-12-23 DIAGNOSIS — J0110 Acute frontal sinusitis, unspecified: Secondary | ICD-10-CM

## 2021-12-23 MED ORDER — AMOXICILLIN-POT CLAVULANATE 875-125 MG PO TABS: 1.0000 | ORAL_TABLET | Freq: Two times a day (BID) | ORAL | 0 refills | Status: DC

## 2021-12-23 NOTE — Progress Notes (Addendum)
Virtual Visit Encounter  mychart visit. ? ? ?I connected with  [name] on 12/23/21 at 10:10 AM EDT by secure video enabled telemedicine application. I verified that I am speaking with the correct person using two identifiers. ?  ?I introduced myself as a Designer, jewellery with the practice. The limitations of evaluation and management by telemedicine discussed with the patient and the availability of in person appointments. The patient expressed verbal understanding and consent to proceed. ? ?Participating parties in this visit include: Myself and patient ? ?The patient is: Patient Location: Other:  work ?I am: Provider Location: Office/Clinic ?Subjective:   ? ?CC and HPI: Douglas Sanford is a 41 y.o. year old male presenting for new evaluation and treatment of sinus symptoms. ?Sinus Sx ?- last Thursday started with some congestion  ?- progressed over the weekend with sore throat, sinus p/p, "swimmy head" feeling ?- pounding headache started today ?- has been taking mucinex which is helping ?- entire family sick with similar symptoms ?- doesn't seem to be improving on it's own ?- no fevers, ear pain ?- COVID test x2 negative ? ?Past medical history, Surgical history, Family history not pertinant except as noted below, Social history, Allergies, and medications have been entered into the medical record, reviewed, and corrections made.  ? ?Review of Systems:  ?All review of systems negative except what is listed in the HPI ? ?Objective:   ? ?Alert and oriented x 4 ?Speaking in clear sentences with no shortness of breath. ?Audibly congested ?No distress. ? ?Impression and Recommendations:   ? ?Problem List Items Addressed This Visit   ?None ?Visit Diagnoses   ? ? Acute non-recurrent frontal sinusitis    -  Primary  ? Relevant Medications  ? amoxicillin-clavulanate (AUGMENTIN) 875-125 MG tablet  ? ?  ?Symptoms consistent with acute frontal sinusitis. No alarm sx present today.  ?Given sx for 5 days with no  improvement will send augmentin today. Recommend continue Mucinex and consider adding flonase, rhinocort, nasocort for reduction of inflammation.  ?If no improvement please contact the office.  ? ?orders and follow up as documented in EMR ?I discussed the assessment and treatment plan with the patient. The patient was provided an opportunity to ask questions and all were answered. The patient agreed with the plan and demonstrated an understanding of the instructions. ?  ?The patient was advised to call back or seek an in-person evaluation if the symptoms worsen or if the condition fails to improve as anticipated. ? ?Follow-Up: prn ? ?I provided 17 minutes of non-face-to-face interaction with this non face-to-face encounter including intake, same-day documentation, and chart review.  ? ?Orma Render, NP , DNP, AGNP-c ?[facility] ?Primary Care & Sports Medicine at [facility] ?[phone] ?[phone] (fax) ? ?

## 2022-01-13 DIAGNOSIS — B309 Viral conjunctivitis, unspecified: Secondary | ICD-10-CM | POA: Diagnosis not present

## 2022-04-11 ENCOUNTER — Other Ambulatory Visit (HOSPITAL_COMMUNITY): Payer: Self-pay

## 2022-05-07 ENCOUNTER — Ambulatory Visit (HOSPITAL_BASED_OUTPATIENT_CLINIC_OR_DEPARTMENT_OTHER): Payer: 59 | Admitting: Family Medicine

## 2022-05-07 ENCOUNTER — Encounter (HOSPITAL_BASED_OUTPATIENT_CLINIC_OR_DEPARTMENT_OTHER): Payer: Self-pay | Admitting: Family Medicine

## 2022-05-07 VITALS — BP 158/93 | HR 96 | Ht 71.0 in | Wt 190.0 lb

## 2022-05-07 DIAGNOSIS — M545 Low back pain, unspecified: Secondary | ICD-10-CM

## 2022-05-07 MED ORDER — CYCLOBENZAPRINE HCL 5 MG PO TABS: 5.0000 mg | ORAL_TABLET | Freq: Three times a day (TID) | ORAL | 1 refills | Status: DC | PRN

## 2022-05-07 MED ORDER — MELOXICAM 15 MG PO TABS: 15.0000 mg | ORAL_TABLET | Freq: Every day | ORAL | 0 refills | Status: DC

## 2022-05-07 NOTE — Assessment & Plan Note (Signed)
Patient is a 41 year old male presenting for evaluation of low back pain.  He indicates that about 3 and half to 4 weeks ago, he was working out doing Archivist swings and felt a pull in his back.  After that time he had increased pain, stiffness and tightness.  Pain affected both sides of lower back, right is slightly worse than left.  At the time he used OTC medications, icing, rest, course of prednisone and did note improvement with this.  Was making progress over the first 2 weeks after injury and returned to exercising.  Unfortunately, he noticed that symptoms have persisted and his improvement seems to have plateaued.  He has not had any radicular symptoms, no pain into lower extremities, no associated numbness or tingling. He does have history of spinal fusion and lumbar spine.  This surgery was more than 15 years ago now. On exam, patient is in no acute distress.  He does not have any significant tenderness to palpation over spinous processes, no significant tenderness to palpation through paraspinal muscles bilaterally.  Negative straight leg raise bilaterally, normal forward lection, normal back extension, no pain with either movements. Discussed options with patient, given that he has had some improvement, however continues to have some residual symptoms, feel that completing formal physical therapy would be of benefit for patient. Recommend continuing with NSAID for control of symptoms, we will allow for trial of meloxicam, did caution that if he is using the meloxicam, to avoid over-the-counter NSAIDs including ibuprofen, Aleve, Advil.  If he does need additional medication for any breakthrough pain, can use Tylenol Can continue with icing.  May proceed with activity as tolerated We will plan for follow-up as needed.  Did discuss that if symptoms are not improving as expected over the next 6 to 8 weeks or if any worsening or new symptoms are noted, to return to clinic.  Consideration would be  for obtaining x-rays, possible MRI

## 2022-05-07 NOTE — Progress Notes (Signed)
    Procedures performed today:    None.  Independent interpretation of notes and tests performed by another provider:   None.  Brief History, Exam, Impression, and Recommendations:    BP (!) 158/93   Pulse 96   Ht '5\' 11"'$  (1.803 m)   Wt 190 lb (86.2 kg)   SpO2 100%   BMI 26.50 kg/m   Acute bilateral low back pain without sciatica Patient is a 41 year old male presenting for evaluation of low back pain.  He indicates that about 3 and half to 4 weeks ago, he was working out doing Archivist swings and felt a pull in his back.  After that time he had increased pain, stiffness and tightness.  Pain affected both sides of lower back, right is slightly worse than left.  At the time he used OTC medications, icing, rest, course of prednisone and did note improvement with this.  Was making progress over the first 2 weeks after injury and returned to exercising.  Unfortunately, he noticed that symptoms have persisted and his improvement seems to have plateaued.  He has not had any radicular symptoms, no pain into lower extremities, no associated numbness or tingling. He does have history of spinal fusion and lumbar spine.  This surgery was more than 15 years ago now. On exam, patient is in no acute distress.  He does not have any significant tenderness to palpation over spinous processes, no significant tenderness to palpation through paraspinal muscles bilaterally.  Negative straight leg raise bilaterally, normal forward lection, normal back extension, no pain with either movements. Discussed options with patient, given that he has had some improvement, however continues to have some residual symptoms, feel that completing formal physical therapy would be of benefit for patient. Recommend continuing with NSAID for control of symptoms, we will allow for trial of meloxicam, did caution that if he is using the meloxicam, to avoid over-the-counter NSAIDs including ibuprofen, Aleve, Advil.  If he does  need additional medication for any breakthrough pain, can use Tylenol Can continue with icing.  May proceed with activity as tolerated We will plan for follow-up as needed.  Did discuss that if symptoms are not improving as expected over the next 6 to 8 weeks or if any worsening or new symptoms are noted, to return to clinic.  Consideration would be for obtaining x-rays, possible MRI  Return if symptoms worsen or fail to improve.   ___________________________________________ Andrell Tallman [place], MD, ABFM, CAQSM Primary Care and [place]

## 2022-05-07 NOTE — Patient Instructions (Signed)

## 2022-05-25 ENCOUNTER — Ambulatory Visit: Payer: 59 | Attending: Family Medicine

## 2022-05-25 DIAGNOSIS — M5459 Other low back pain: Secondary | ICD-10-CM | POA: Diagnosis not present

## 2022-05-25 DIAGNOSIS — M545 Low back pain, unspecified: Secondary | ICD-10-CM | POA: Diagnosis not present

## 2022-05-25 NOTE — Therapy (Signed)
OUTPATIENT PHYSICAL THERAPY THORACOLUMBAR EVALUATION   Patient Name: Douglas Sanford MRN: [id number] DOB:Mar 18, 1981, 41 y.o., male Today's Date: 05/25/2022   PT End of Session - 05/25/22 1642     Visit Number 1    Number of Visits 7    Date for PT Re-Evaluation 07/06/22    PT Start Time 1547    PT Stop Time 1632    PT Time Calculation (min) 45 min    Activity Tolerance Patient tolerated treatment well    Behavior During Therapy [facility] for tasks assessed/performed             Past Medical History:  Diagnosis Date   Low back pain    Past Surgical History:  Procedure Laterality Date   BACK SURGERY     Patient Active Problem List   Diagnosis Date Noted   Acute bilateral low back pain without sciatica 05/07/2022   Acute cough 08/18/2021   Screening for endocrine, nutritional, metabolic and immunity disorder 08/09/2021   Screening for lipid disorders 08/09/2021   Screening for deficiency anemia 08/09/2021   Encounter for annual physical exam 08/09/2021    PCP: Orma Render, NP  REFERRING PROVIDER: [place], Raymond  REFERRING DIAG: M54.50 (ICD-10-CM) - Acute bilateral low back pain without sciatica   Rationale for Evaluation and Treatment Rehabilitation  THERAPY DIAG:  Other low back pain  ONSET DATE: June 25th to 26th   SUBJECTIVE:                                                                                                                                                                                           SUBJECTIVE STATEMENT: Pt referred to PT for low back pain. Pt performing kettle bell swings feeling a pull in low back near SIJ. Initially only had 10 days off of pain and was back to working out. But after ~2 weeks pt had re-onset of LBP thinking he returning to high intensity activity too quickly. Has regressed work outs and ahs adapted them to his tolerance reports no issues thinking he is about 80-85% better. Was initially across low back but lingering  still on R side. Has markedly improved pain initially up to 7/10 NPS. Does have time he is pain free but currently 2-3/10 NPS. Aggravating factors include: sitting for work and very high intensity work outs. Does have some lingering pain with lumbar flexion tasks and some rotation R > L. Initially pain described as sharp and burning. Worst pain currently 2-3/10 NPS. Does improve with movement. Denies radicular symptoms.   PERTINENT HISTORY:  Per orthopedic: Acute bilateral low back pain without sciatica Patient is  a 41 year old male presenting for evaluation of low back pain.  He indicates that about 3 and half to 4 weeks ago, he was working out doing Archivist swings and felt a pull in his back.  After that time he had increased pain, stiffness and tightness.  Pain affected both sides of lower back, right is slightly worse than left.  At the time he used OTC medications, icing, rest, course of prednisone and did note improvement with this.  Was making progress over the first 2 weeks after injury and returned to exercising.  Unfortunately, he noticed that symptoms have persisted and his improvement seems to have plateaued.  He has not had any radicular symptoms, no pain into lower extremities, no associated numbness or tingling. He does have history of spinal fusion and lumbar spine.  This surgery was more than 15 years ago now.  Pt referred to PT for low back pain. Pt performing kettle bell swings feeling a pull in low back near SIJ. Initially only had 10 days off of pain and was back to working out. But after ~2 weeks pt had re-onset of LBP thinking he returning to high intensity activity too quickly. Has regressed work outs and ahs adapted them to his tolerance reports no issues thinking he is about 80-85% better. Was initially across low back but lingering still on R side. Has markedly improved pain initially up to 7/10 NPS. Does have time he is pain free but currently 2-3/10 NPS. Aggravating factors  include: sitting for work and very high intensity work outs. Does have some lingering pain with lumbar flexion tasks and some rotation R > L. Initially pain described as sharp and burning. Worst pain currently 2-3/10 NPS. Does improve with movement. Denies radicular symptoms.    PAIN:  Are you having pain? Yes: NPRS scale: 2-3/10 Pain location: R sided LBP Pain description: Sharp and burning Aggravating factors: Lumbar flexion, sitting and sleeping prolonged periods of time Relieving factors: Activity, stretching   PRECAUTIONS: None  WEIGHT BEARING RESTRICTIONS No  FALLS:  Has patient fallen in last 6 months? No   OCCUPATION: works for [employer]. 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  PLOF: Independent  PATIENT GOALS Return to pain free ADL's and high intensity work outs.   OBJECTIVE:   DIAGNOSTIC FINDINGS:  N/A  PATIENT SURVEYS:  FOTO 65/75  SCREENING FOR RED FLAGS: Bowel or bladder incontinence: No Spinal tumors: No Cauda equina syndrome: No Compression fracture: No Abdominal aneurysm: No  COGNITION:  Overall cognitive status: Within functional limits for tasks assessed     SENSATION: Not tested  MUSCLE LENGTH: Hamstrings: Right 75 deg; Left 105 deg Thomas test: Right WN with knee extended and flexed; Left WNL with knee extended and flexed  POSTURE:  In standing, subtle L sided weight shift and L truncal lean  PALPATION: TTP along lateral R sided lumbar paraspinals and deep pressure near quadratus lumborum origin.  LUMBAR ROM:   Active  A/PROM  eval  Flexion   Extension   Right lateral flexion   Left lateral flexion   Right rotation   Left rotation    (Blank rows = not tested)  JOINT SPRINGING:  HIP: A/P mobilizations normal and painless  LUMBAR: CPA hypomobile but less S1 to T12. Hypomobile UPA's bilaterally S1 to L1. Reports ease of symptoms with R sided mobs.     LOWER EXTREMITY ROM:     Active  Right eval Left eval  Hip flexion WNL WNL  Hip  extension  Hip abduction    Hip adduction    Hip internal rotation 10 2  Hip external rotation WNL WNL  Knee flexion WNL WNL  Knee extension WNL WNL  Ankle dorsiflexion    Ankle plantarflexion    Ankle inversion    Ankle eversion     (Blank rows = not tested)  LUMBAR SPECIAL TESTS:  Straight leg raise test: Negative, SI Compression/distraction test: Gaenslen's, thig thrust, sacral thrust all negative, FABER test: FADDIR test negative, and Thomas test: Negative, Ober's negative   FUNCTIONAL TESTS:  Assess aggravating exercises at next session  GAIT: Normal, reciprocal arm swing step through gait.    TODAY'S TREATMENT  Review of HEP, POC, prognosis.   PATIENT EDUCATION:  Education details: Prognosis, POC, HEP Person educated: Patient Education method: Theatre stage manager Education comprehension: verbalized understanding   HOME EXERCISE PROGRAM:  Access Code: [email or web address] URL: [email or web address] Date: 05/25/2022 Prepared by: Larna Daughters  Exercises - Supine Double Knee to Chest  - 1 x daily - 7 x weekly - 2-3 sets - 20 reps - Supine Hamstring Stretch with Strap  - 2-3 x daily - 7 x weekly - 2 sets - 3 reps - 20-30 hold - Standing Quadratus Lumborum Stretch with Doorway  - 1 x daily - 7 x weekly - 2-3 sets - 3 reps - 20-30 hold  ASSESSMENT:  CLINICAL IMPRESSION: Patient is a 41 y.o. male who was seen today for physical therapy evaluation and treatment for LBP. Pt presents without radicular symptoms and cleared from SIJ or hip related pathology. Pt does present with normal lumbar mobility in all planes with minor concordant symptoms with lumbar flexion and L lateral flexion at end ranges with overpressure. Pt also has some limitations in hamstring flexibility by 30 degrees from right to left and hip IR but believe hip IR to be irrelevant finding. Reproduction of pain noted with deep QL/lateral hip stretch with deep palpation to QL. Signs/symptoms most  concordant with muscular strain to R sided lateral lumbar region. This pain is limiting full participation in recreational activities and prolonged seated work tasks and bending over ADL's. Pt will benefit from skilled PT services to improve full, painless mobility and pain free recreational activity and ADL completion to return to PLOF.     OBJECTIVE IMPAIRMENTS decreased ROM, hypomobility, impaired flexibility, and pain.   ACTIVITY LIMITATIONS lifting, bending, and sitting  PARTICIPATION LIMITATIONS: occupation and Recreational activity  PERSONAL FACTORS Age, Fitness, Past/current experiences, Profession, and Time since onset of injury/illness/exacerbation are also affecting patient's functional outcome.   REHAB POTENTIAL: Excellent  CLINICAL DECISION MAKING: Stable/uncomplicated  EVALUATION COMPLEXITY: Low   GOALS: Goals reviewed with patient? No  SHORT TERM GOALS: Target date: 06/15/2022  Pt will be indep with HEP to reduce pain and improve flexibility to return to PLOF Baseline: Given HEP Goal status: INITIAL  LONG TERM GOALS: Target date: 07/06/2022  Pt will improve FOTO to target score to demonstrate clinically significant improvement in functional mobility Baseline: 65 with target of 75 Goal status: INITIAL  2.  Pt will improve R hamstring mobility equal  L hamstring for symmetrical hamstring length for recreational activity  Baseline: R hamstring SLR 75 degrees, L hamstring SLR: 105 degrees Goal status: INITIAL  3.  Pt will report no pain with normal, high intensity exercise to demonstrate return to full participation to recreational activity Baseline: up to 2-3/10 pain in R sided low back Goal status: INITIAL  PLAN: PT FREQUENCY: 1-2x/week  PT DURATION: 6 weeks  PLANNED INTERVENTIONS: Therapeutic exercises, Therapeutic activity, Neuromuscular re-education, Balance training, Gait training, Patient/Family education, Self Care, Joint mobilization, Joint manipulation,  Dry Needling, Spinal manipulation, Spinal mobilization, Cryotherapy, Moist heat, and Manual therapy.  PLAN FOR NEXT SESSION: Reassess HEP, assess form with some exercises that cause patient pain.    Salem Caster. Fairly IV, PT, DPT Physical Therapist- [facility]  05/25/2022, 4:45 PM

## 2022-05-31 ENCOUNTER — Other Ambulatory Visit (HOSPITAL_BASED_OUTPATIENT_CLINIC_OR_DEPARTMENT_OTHER): Payer: Self-pay | Admitting: Family Medicine

## 2022-05-31 DIAGNOSIS — M545 Low back pain, unspecified: Secondary | ICD-10-CM

## 2022-06-01 ENCOUNTER — Ambulatory Visit: Payer: 59

## 2022-06-01 DIAGNOSIS — M545 Low back pain, unspecified: Secondary | ICD-10-CM | POA: Diagnosis not present

## 2022-06-01 DIAGNOSIS — M5459 Other low back pain: Secondary | ICD-10-CM

## 2022-06-01 NOTE — Therapy (Signed)
OUTPATIENT PHYSICAL THERAPY TREATMENT NOTE   Patient Name: Douglas Sanford MRN: [id number] DOB:1981-08-20, 41 y.o., male Today's Date: 06/01/2022  PCP: Orma Render, NP REFERRING PROVIDER: de [place], Raymond J, MD   PT End of Session - 06/01/22 1545     Visit Number 2    Number of Visits 7    Date for PT Re-Evaluation 07/06/22    PT Start Time 6283    PT Stop Time 1633    PT Time Calculation (min) 48 min    Activity Tolerance Patient tolerated treatment well    Behavior During Therapy [facility] for tasks assessed/performed             Past Medical History:  Diagnosis Date   Low back pain    Past Surgical History:  Procedure Laterality Date   BACK SURGERY     Patient Active Problem List   Diagnosis Date Noted   Acute bilateral low back pain without sciatica 05/07/2022   Acute cough 08/18/2021   Screening for endocrine, nutritional, metabolic and immunity disorder 08/09/2021   Screening for lipid disorders 08/09/2021   Screening for deficiency anemia 08/09/2021   Encounter for annual physical exam 08/09/2021    REFERRING DIAG: M54.50 (ICD-10-CM) - Acute bilateral low back pain without sciatica   THERAPY DIAG:  Other low back pain  Rationale for Evaluation and Treatment Rehabilitation  PERTINENT HISTORY: Pt referred to PT for low back pain. Pt performing kettle bell swings feeling a pull in low back near SIJ. Initially only had 10 days off of pain and was back to working out. But after ~2 weeks pt had re-onset of LBP thinking he returning to high intensity activity too quickly. Has regressed work outs and ahs adapted them to his tolerance reports no issues thinking he is about 80-85% better. Was initially across low back but lingering still on R side. Has markedly improved pain initially up to 7/10 NPS. Does have time he is pain free but currently 2-3/10 NPS. Aggravating factors include: sitting for work and very high intensity work outs. Does have some lingering pain with  lumbar flexion tasks and some rotation R > L. Initially pain described as sharp and burning. Worst pain currently 2-3/10 NPS. Does improve with movement. Denies radicular symptoms.    PRECAUTIONS: No known precautions  SUBJECTIVE: Back is pretty loose right now. Feels R low back/upper pelvis pain. The more active he is, the better he feels. Works out every day. Typically sleeps on his back. Has L4-L5 fusion 18 years ago. Downward dog feels good to low back. No disc issues on MRI  PAIN:  Are you having pain? Slight R low back pain, not bad, about 1/10 currently     TODAY'S TREATMENT:      Therapeutic exercise  SLR test (+) R and L LE  Hip PROM in 90/90 position  Limited L hip with slight R lumbar side bend reproducing symptoms. No symptoms with L pelvis stabilized   Limitd R hip with slight R low back discomfort.   (-) long sit test   Supine piriformis stretch with PT   L 30 seconds. Low back discomfort.   Supine Piriformis stretch (knee to opposite shoulder)  L 1 minute x 3  R 1 minute x 3 with PT assist during first rep  Quadruped posterior hip capsule stretch  R 30 seconds x 3  L 30 seconds x 3  Standing lumbar extension 10x5 seconds, then 10x10 seconds   Decreased low back  pain.   Standing R lumbar side bend 10x5 seconds.    Planks 1 minute, then 30 seconds.   Mid back pull after second set.   Standing heel toe raises 10x  Add seated LE neural flossing next visit if appropriate.     Improved exercise technique, movement at target joints, use of target muscles after mod verbal, visual, tactile cues.      Response to treatment  Decreased R low back pain after session. B upper lumbar paraspinal muscle discomfort after session.    Clinical impression  Pt demonstrates lumbar extension directional preference with a reduction in R low back pain with standing lumbar extension exercise. Also worked on improving B hip joint mobility and decreasing  B  piriformis muscle tension to promote movement at his hips and decrease stress to his low back. Pt will benefit from continued skilled physical therapy services to decrease pain, and improve function.           PATIENT EDUCATION: Education details: there-ex, HEP Person educated: Patient Education method: Explanation, Demonstration, Tactile cues, Verbal cues, and Handouts Education comprehension: verbalized understanding and returned demonstration   HOME EXERCISE PROGRAM: Access Code: [facility] URL: [email or web address] Date: 06/01/2022 Prepared by: Joneen Boers  Exercises - Supine Piriformis Stretch  - 1 x daily - 7 x weekly - 1 sets - 3 reps - 1 minute hold - Standing Lumbar Extension  - 1 x daily - 7 x weekly - 2 sets - 10 reps - 10 seconds hold     PT Short Term Goals - 06/01/22 1841       PT SHORT TERM GOAL #1   Title Pt will be indep with HEP to reduce pain and improve flexibility to return to PLOF    Baseline Given HEP (05/25/2022)    Time 3    Period Weeks    Status New    Target Date 06/15/22              PT Long Term Goals - 06/01/22 1843       PT LONG TERM GOAL #1   Title Pt will improve FOTO to target score to demonstrate clinically significant improvement in functional mobility    Baseline 65 with target of 75 (05/25/2022)    Time 6    Period Weeks    Status New    Target Date 07/06/22      PT LONG TERM GOAL #2   Title Pt will improve R hamstring mobility equal  L hamstring for symmetrical hamstring length for recreational activity    Baseline R hamstring SLR 75 degrees, L hamstring SLR: 105 degrees (05/25/2022)    Time 6    Period Weeks    Status New    Target Date 07/06/22      PT LONG TERM GOAL #3   Title Pt will report no pain with normal, high intensity exercise to demonstrate return to full participation to recreational activity.    Baseline up to 2-3/10 pain in R sided low back  (06/01/2022)    Time 6    Period Weeks     Status New    Target Date 07/06/22              Plan - 06/01/22 1837     Clinical Impression Statement Pt demonstrates lumbar extension directional preference with a reduction in R low back pain with standing lumbar extension exercise. Also worked on improving B hip joint mobility and decreasing  B piriformis muscle tension  to promote movement at his hips and decrease stress to his low back. Pt will benefit from continued skilled physical therapy services to decrease pain, and improve function.    PT Frequency 2x / week   1-2x per week   PT Duration 6 weeks    PT Treatment/Interventions Therapeutic activities;Therapeutic exercise;Neuromuscular re-education;Balance training;Gait training;Patient/family education;ADLs/Self Care Home Management;Joint Manipulations;Dry needling;Spinal Manipulations;Cryotherapy;Moist Heat;Manual techniques    PT Next Visit Plan extension preference, hip stretches and mobiliation, manual techniques, modalities PRN    PT Home Exercise Plan Medbridge Access Code: [facility]    Consulted and Agree with Plan of Care Patient                      Joneen Boers PT, DPT   06/01/2022, 6:54 PM

## 2022-06-08 ENCOUNTER — Ambulatory Visit: Payer: 59

## 2022-06-22 ENCOUNTER — Ambulatory Visit: Payer: 59

## 2022-07-08 ENCOUNTER — Telehealth: Payer: 59 | Admitting: Physician Assistant

## 2022-07-08 DIAGNOSIS — B9689 Other specified bacterial agents as the cause of diseases classified elsewhere: Secondary | ICD-10-CM | POA: Diagnosis not present

## 2022-07-08 DIAGNOSIS — J019 Acute sinusitis, unspecified: Secondary | ICD-10-CM | POA: Diagnosis not present

## 2022-07-08 MED ORDER — BENZONATATE 100 MG PO CAPS: 100.0000 mg | ORAL_CAPSULE | Freq: Three times a day (TID) | ORAL | 0 refills | Status: DC | PRN

## 2022-07-08 MED ORDER — AZITHROMYCIN 250 MG PO TABS: ORAL_TABLET | ORAL | 0 refills | Status: AC

## 2022-07-08 NOTE — Progress Notes (Signed)
E-Visit for Sinus Problems  We are sorry that you are not feeling well.  Here is how we plan to help!  Based on what you have shared with me it looks like you have sinusitis.  Sinusitis is inflammation and infection in the sinus cavities of the head.  Based on your presentation I believe you most likely have Acute Bacterial Sinusitis.  This is an infection caused by bacteria and is treated with antibiotics. I have prescribed Azithromycin to take as directed, along with a prescription cough medication. You may use an oral decongestant such as Mucinex D or if you have glaucoma or high blood pressure use plain Mucinex. Saline nasal spray help and can safely be used as often as needed for congestion.  If you develop worsening sinus pain, fever or notice severe headache and vision changes, or if symptoms are not better after completion of antibiotic, please schedule an appointment with a health care provider.    Sinus infections are not as easily transmitted as other respiratory infection, however we still recommend that you avoid close contact with loved ones, especially the very young and elderly.  Remember to wash your hands thoroughly throughout the day as this is the number one way to prevent the spread of infection!  Home Care: Only take medications as instructed by your medical team. Complete the entire course of an antibiotic. Do not take these medications with alcohol. A steam or ultrasonic humidifier can help congestion.  You can place a towel over your head and breathe in the steam from hot water coming from a faucet. Avoid close contacts especially the very young and the elderly. Cover your mouth when you cough or sneeze. Always remember to wash your hands.  Get Help Right Away If: You develop worsening fever or sinus pain. You develop a severe head ache or visual changes. Your symptoms persist after you have completed your treatment plan.  Make sure you Understand these  instructions. Will watch your condition. Will get help right away if you are not doing well or get worse.  Thank you for choosing an e-visit.  Your e-visit answers were reviewed by a board certified advanced clinical practitioner to complete your personal care plan. Depending upon the condition, your plan could have included both over the counter or prescription medications.  Please review your pharmacy choice. Make sure the pharmacy is open so you can pick up prescription now. If there is a problem, you may contact your provider through [employer] and have the prescription routed to another pharmacy.  Your safety is important to [place]. If you have drug allergies check your prescription carefully.   For the next 24 hours you can use MyChart to ask questions about today's visit, request a non-urgent call back, or ask for a work or school excuse. You will get an email in the next two days asking about your experience. I hope that your e-visit has been valuable and will speed your recovery.

## 2022-07-08 NOTE — Progress Notes (Signed)
I have spent 5 minutes in review of e-visit questionnaire, review and updating patient chart, medical decision making and response to patient.   Dezarai Prew Cody Kristion Holifield, PA-C    

## 2022-08-10 ENCOUNTER — Ambulatory Visit (INDEPENDENT_AMBULATORY_CARE_PROVIDER_SITE_OTHER): Payer: 59 | Admitting: Nurse Practitioner

## 2022-08-10 ENCOUNTER — Encounter (HOSPITAL_BASED_OUTPATIENT_CLINIC_OR_DEPARTMENT_OTHER): Payer: Self-pay | Admitting: Nurse Practitioner

## 2022-08-10 VITALS — BP 134/97 | HR 89 | Ht 71.0 in | Wt 197.0 lb

## 2022-08-10 DIAGNOSIS — Z0000 Encounter for general adult medical examination without abnormal findings: Secondary | ICD-10-CM | POA: Diagnosis not present

## 2022-08-10 NOTE — Progress Notes (Signed)
BP (!) 134/97   Pulse 89   Ht 5' 11" (1.803 m)   Wt 197 lb (89.4 kg)   SpO2 97%   BMI 27.48 kg/m    Subjective:    Patient ID: Douglas Sanford, male    DOB: 1981-04-16, 41 y.o.   MRN: [id number]  HPI: Douglas Sanford is a 41 y.o. male presenting on 08/10/2022 for comprehensive medical examination.   Current medical concerns include:none  He reports regular vision exams q1-5y: yes He reports regular dental exams q 26m yes His diet consists of:  Very healthy options-  He endorses exercise and/or activity of: Running, Weigh training, cardio about 6 hours per week He works as:  MRI at [employer] He denies ETOH use  He denies nictoine use  He denies illegal substance use  He is sexually active with spouse and no other partners. He denies concerns today about STI  He denies concerns about skin changes today  He denies concerns about bowel changes today  He denies concerns about bladder changes today   Most Recent Depression Screen:     05/07/2022    3:46 PM 08/08/2021    2:43 PM  Depression screen PHQ 2/9  Decreased Interest 0 0  Down, Depressed, Hopeless 0 0  PHQ - 2 Score 0 0  Altered sleeping 0 0  Tired, decreased energy 0 0  Change in appetite 0 0  Feeling bad or failure about yourself  0 0  Trouble concentrating 0 0  Moving slowly or fidgety/restless 0 0  Suicidal thoughts 0 0  PHQ-9 Score 0 0  Difficult doing work/chores Not difficult at all    Most Recent Anxiety Screen:    08/08/2021    2:43 PM  GAD 7 : Generalized Anxiety Score  Nervous, Anxious, on Edge 0  Control/stop worrying 0  Worry too much - different things 0  Trouble relaxing 0  Restless 0  Easily annoyed or irritable 0  Afraid - awful might happen 0  Total GAD 7 Score 0   Most Recent Falls Screen:    05/07/2022    3:45 PM 08/08/2021    2:43 PM  Fall Risk   Falls in the past year? 0 0  Number falls in past yr: 0 0  Injury with Fall? 0 0  Risk for fall due to : No Fall Risks No Fall Risks   Follow up Falls evaluation completed Falls evaluation completed    Past medical history, surgical history, medications, allergies, family history and social history reviewed with patient today and changes made to appropriate areas of the chart.  Past Medical History:  Past Medical History:  Diagnosis Date   Low back pain    Medications:  Current Outpatient Medications on File Prior to Visit  Medication Sig   anastrozole (ARIMIDEX) 1 MG tablet Take 1 mg by mouth daily.   No current facility-administered medications on file prior to visit.   Surgical History:  Past Surgical History:  Procedure Laterality Date   BACK SURGERY     Allergies:  No Known Allergies Social History:  Social History   Socioeconomic History   Marital status: Married    Spouse name: Not on file   Number of children: 2   Years of education: Not on file   Highest education level: Not on file  Occupational History   Not on file  Tobacco Use   Smoking status: Never   Smokeless tobacco: Never  Vaping Use  Vaping Use: Never used  Substance and Sexual Activity   Alcohol use: Not Currently   Drug use: Never   Sexual activity: Not on file  Other Topics Concern   Not on file  Social History Narrative   62 month old son and 72 year old daughter.    Social Determinants of Health   Financial Resource Strain: Not on file  Food Insecurity: Not on file  Transportation Needs: Not on file  Physical Activity: Not on file  Stress: Not on file  Social Connections: Not on file  Intimate Partner Violence: Not on file   Social History   Tobacco Use  Smoking Status Never  Smokeless Tobacco Never   Social History   Substance and Sexual Activity  Alcohol Use Not Currently   Family History:  History reviewed. No pertinent family history.   All ROS negative except what is listed above and in the HPI.      Objective:    BP (!) 134/97   Pulse 89   Ht 5' 11" (1.803 m)   Wt 197 lb (89.4 kg)    SpO2 97%   BMI 27.48 kg/m   Wt Readings from Last 3 Encounters:  08/10/22 197 lb (89.4 kg)  05/07/22 190 lb (86.2 kg)  08/08/21 193 lb 12.8 oz (87.9 kg)    Physical Exam Vitals and nursing note reviewed.  Constitutional:      General: He is not in acute distress.    Appearance: Normal appearance.  HENT:     Head: Normocephalic and atraumatic.     Right Ear: Hearing, tympanic membrane, ear canal and external ear normal.     Left Ear: Hearing, tympanic membrane, ear canal and external ear normal.     Nose: Nose normal.     Right Sinus: No maxillary sinus tenderness or frontal sinus tenderness.     Left Sinus: No maxillary sinus tenderness or frontal sinus tenderness.     Mouth/Throat:     Lips: Pink.     Mouth: Mucous membranes are moist.     Pharynx: Oropharynx is clear.  Eyes:     General: Lids are normal. Vision grossly intact.     Extraocular Movements: Extraocular movements intact.     Conjunctiva/sclera: Conjunctivae normal.     Pupils: Pupils are equal, round, and reactive to light.     Funduscopic exam:    Right eye: No hemorrhage. Red reflex present.        Left eye: No hemorrhage. Red reflex present.    Visual Fields: Right eye visual fields normal and left eye visual fields normal.  Neck:     Thyroid: No thyromegaly.     Vascular: No carotid bruit or JVD.  Cardiovascular:     Rate and Rhythm: Normal rate and regular rhythm.     Chest Wall: PMI is not displaced.     Pulses: Normal pulses.          Dorsalis pedis pulses are 2+ on the right side and 2+ on the left side.       Posterior tibial pulses are 2+ on the right side and 2+ on the left side.     Heart sounds: Normal heart sounds. No murmur heard. Pulmonary:     Effort: Pulmonary effort is normal. No respiratory distress.     Breath sounds: Normal breath sounds.  Chest:  Breasts:    Breasts are symmetrical.  Abdominal:     General: Bowel sounds are normal. There is no  distension or abdominal bruit.      Palpations: Abdomen is soft. There is no hepatomegaly, splenomegaly or mass.     Tenderness: There is no abdominal tenderness. There is no right CVA tenderness, left CVA tenderness, guarding or rebound.  Musculoskeletal:        General: Normal range of motion.     Cervical back: Full passive range of motion without pain and neck supple. No tenderness. No spinous process tenderness or muscular tenderness.     Right lower leg: No edema.     Left lower leg: No edema.  Feet:     Right foot:     Toenail Condition: Right toenails are normal.     Left foot:     Toenail Condition: Left toenails are normal.  Lymphadenopathy:     Cervical: No cervical adenopathy.     Upper Body:     Right upper body: No supraclavicular adenopathy.     Left upper body: No supraclavicular adenopathy.  Skin:    General: Skin is warm and dry.     Capillary Refill: Capillary refill takes less than 2 seconds.     Nails: There is no clubbing.  Neurological:     General: No focal deficit present.     Mental Status: He is alert and oriented to person, place, and time.     Cranial Nerves: No cranial nerve deficit.     Sensory: Sensation is intact. No sensory deficit.     Motor: Motor function is intact. No weakness.     Coordination: Coordination is intact. Coordination normal.     Gait: Gait is intact. Gait normal.  Psychiatric:        Attention and Perception: Attention normal.        Mood and Affect: Mood normal.        Speech: Speech normal.        Behavior: Behavior normal. Behavior is cooperative.        Thought Content: Thought content normal.        Cognition and Memory: Cognition and memory normal.        Judgment: Judgment normal.     Results for orders placed or performed in visit on 08/10/22  CBC with Differential/Platelet  Result Value Ref Range   WBC 7.4 3.4 - 10.8 x10E3/uL   RBC 5.45 4.14 - 5.80 x10E6/uL   Hemoglobin 17.1 13.0 - 17.7 g/dL   Hematocrit 50.9 37.5 - 51.0 %   MCV 93 79 - 97 fL    MCH 31.4 26.6 - 33.0 pg   MCHC 33.6 31.5 - 35.7 g/dL   RDW 11.3 (L) 11.6 - 15.4 %   Platelets 220 150 - 450 x10E3/uL   Neutrophils 66 Not Estab. %   Lymphs 21 Not Estab. %   Monocytes 10 Not Estab. %   Eos 2 Not Estab. %   Basos 1 Not Estab. %   Neutrophils Absolute 5.0 1.4 - 7.0 x10E3/uL   Lymphocytes Absolute 1.6 0.7 - 3.1 x10E3/uL   Monocytes Absolute 0.7 0.1 - 0.9 x10E3/uL   EOS (ABSOLUTE) 0.1 0.0 - 0.4 x10E3/uL   Basophils Absolute 0.1 0.0 - 0.2 x10E3/uL   Immature Granulocytes 0 Not Estab. %   Immature Grans (Abs) 0.0 0.0 - 0.1 x10E3/uL  Comprehensive metabolic panel  Result Value Ref Range   Glucose 85 70 - 99 mg/dL   BUN 26 (H) 6 - 20 mg/dL   Creatinine, Ser 1.18 0.76 - 1.27 mg/dL   eGFR 80 >  59 mL/min/1.73   BUN/Creatinine Ratio 22 (H) 9 - 20   Sodium 143 134 - 144 mmol/L   Potassium 4.2 3.5 - 5.2 mmol/L   Chloride 104 96 - 106 mmol/L   CO2 25 20 - 29 mmol/L   Calcium 9.3 8.7 - 10.2 mg/dL   Total Protein 6.9 6.0 - 8.5 g/dL   Albumin 4.7 4.1 - 5.1 g/dL   Globulin, Total 2.2 1.5 - 4.5 g/dL   Albumin/Globulin Ratio 2.1 1.2 - 2.2   Bilirubin Total 0.4 0.0 - 1.2 mg/dL   Alkaline Phosphatase 51 44 - 121 IU/L   AST 33 0 - 40 IU/L   ALT 33 0 - 44 IU/L  Hemoglobin A1c  Result Value Ref Range   Hgb A1c MFr Bld 4.7 (L) 4.8 - 5.6 %   Est. average glucose Bld gHb Est-mCnc 88 mg/dL  LP+LDL Direct  Result Value Ref Range   Cholesterol, Total 138 100 - 199 mg/dL   Triglycerides 44 0 - 149 mg/dL   HDL 57 >39 mg/dL   VLDL Cholesterol Cal 10 5 - 40 mg/dL   LDL Chol Calc (NIH) 71 0 - 99 mg/dL   LDL Direct 78 0 - 99 mg/dL  VITAMIN D 25 Hydroxy (Vit-D Deficiency, Fractures)  Result Value Ref Range   Vit D, 25-Hydroxy 70.3 30.0 - 100.0 ng/mL      Assessment & Plan:   Problem List Items Addressed This Visit     Encounter for annual physical exam - Primary   Relevant Orders   CBC with Differential/Platelet (Completed)   Comprehensive metabolic panel (Completed)    Hemoglobin A1c (Completed)   LP+LDL Direct (Completed)   VITAMIN D 25 Hydroxy (Vit-D Deficiency, Fractures) (Completed)   Other Visit Diagnoses     Health maintenance examination       Relevant Orders   CBC with Differential/Platelet (Completed)   Comprehensive metabolic panel (Completed)   Hemoglobin A1c (Completed)   LP+LDL Direct (Completed)   VITAMIN D 25 Hydroxy (Vit-D Deficiency, Fractures) (Completed)        Follow up plan: NEXT PREVENTATIVE PHYSICAL DUE IN 1 YEAR. Return in about 1 year (around 08/11/2023) for cpe.  LABORATORY TESTING:  Health maintenance labs ordered today, if applicable.  - STI testing: deferred  IMMUNIZATIONS:   - Tdap: Tetanus vaccination status reviewed: last tetanus booster within 10 years. - Influenza: Up to date - Pneumovax: Not applicable - Prevnar: Not applicable - HPV: Not applicable - Zostavax vaccine: Not applicable  SCREENING: - Colonoscopy: Not applicable  Discussed with patient purpose of the colonoscopy is to detect colon cancer at curable precancerous or early stages  - AAA Screening: Not applicable  - Hearing Test: Not applicable  - Spirometry: Not applicable  - PSA: Not applicable   PATIENT COUNSELING:   For all adult patients, I recommend A well balanced diet low in saturated fats, cholesterol, and moderation in carbohydrates.   This can be as simple as monitoring portion sizes and cutting back on sugary beverages such as soda and juice to start with.    Daily water consumption of at least 64 ounces.  Physical activity at least 180 minutes per week, if just starting out.   This can be as simple as taking the stairs instead of the elevator and walking 2-3 laps around the office  purposefully every day.   STD protection, partner selection, and regular testing if high risk.  Limited consumption of alcoholic beverages if alcohol is consumed.  For  women, I recommend no more than 7 alcoholic beverages per week, spread out  throughout the week.  Avoid "binge" drinking or consuming large quantities of alcohol in one setting.   Please let me know if you feel you may need help with reduction or quitting alcohol consumption.   Avoidance of nicotine, if used.  Please let me know if you feel you may need help with reduction or quitting nicotine use.   Daily mental health attention.  This can be in the form of 5 minute daily meditation, prayer, journaling, yoga, reflection, etc.   Purposeful attention to your emotions and mental state can significantly improve your overall wellbeing  and  Health.  Please know that I am here to help you with all of your health care goals and am happy to work with you to find a solution that works best for you.  The greatest advice I have received with any changes in life are to take it one step at a time, that even means if all you can focus on is the next 60 seconds, then do that and celebrate your victories.  With any changes in life, you will have set backs, and that is OK. The important thing to remember is, if you have a set back, it is not a failure, it is an opportunity to try again!  Health Maintenance Recommendations Screening Testing Mammogram Every 1 -2 years based on history and risk factors Starting at age 43 Pap Smear Ages 21-39 every 3 years Ages 3-65 every 5 years with HPV testing More frequent testing may be required based on results and history Colon Cancer Screening Every 1-10 years based on test performed, risk factors, and history Starting at age 40 Bone Density Screening Every 2-10 years based on history Starting at age 19 for women Recommendations for men differ based on medication usage, history, and risk factors AAA Screening One time ultrasound Men 30-36 years old who have every smoked Lung Cancer Screening Low Dose Lung CT every 12 months Age 39-80 years with a 30 pack-year smoking history who still smoke or who have quit within the last 15  years  Screening Labs Routine  Labs: Complete Blood Count (CBC), Complete Metabolic Panel (CMP), Cholesterol (Lipid Panel) Every 6-12 months based on history and medications May be recommended more frequently based on current conditions or previous results Hemoglobin A1c Lab Every 3-12 months based on history and previous results Starting at age 23 or earlier with diagnosis of diabetes, high cholesterol, BMI >26, and/or risk factors Frequent monitoring for patients with diabetes to ensure blood sugar control Thyroid Panel (TSH w/ T3 & T4) Every 6 months based on history, symptoms, and risk factors May be repeated more often if on medication HIV One time testing for all patients 56 and older May be repeated more frequently for patients with increased risk factors or exposure Hepatitis C One time testing for all patients 73 and older May be repeated more frequently for patients with increased risk factors or exposure Gonorrhea, Chlamydia Every 12 months for all sexually active persons 13-24 years Additional monitoring may be recommended for those who are considered high risk or who have symptoms PSA Men 3-73 years old with risk factors Additional screening may be recommended from age 39-69 based on risk factors, symptoms, and history  Vaccine Recommendations Tetanus Booster All adults every 10 years Flu Vaccine All patients 6 months and older every year COVID Vaccine All patients 12 years and older Initial dosing with booster  May recommend additional booster based on age and health history HPV Vaccine 2 doses all patients age 39-26 Dosing may be considered for patients over 26 Shingles Vaccine (Shingrix) 2 doses all adults 4 years and older Pneumonia (Pneumovax 23) All adults 70 years and older May recommend earlier dosing based on health history Pneumonia (Prevnar 13) All adults 77 years and older Dosed 1 year after Pneumovax 23  Additional Screening, Testing, and  Vaccinations may be recommended on an individualized basis based on family history, health history, risk factors, and/or exposure.

## 2022-08-10 NOTE — Patient Instructions (Signed)
It was so good to see you today! If you have any questions don't hesitate to reach out.   I will let you know what your labs show.   I have sent the referral to dermatology for you.

## 2022-08-11 LAB — COMPREHENSIVE METABOLIC PANEL
ALT: 33 IU/L (ref 0–44)
AST: 33 IU/L (ref 0–40)
Albumin/Globulin Ratio: 2.1 (ref 1.2–2.2)
Albumin: 4.7 g/dL (ref 4.1–5.1)
Alkaline Phosphatase: 51 IU/L (ref 44–121)
BUN/Creatinine Ratio: 22 — ABNORMAL HIGH (ref 9–20)
BUN: 26 mg/dL — ABNORMAL HIGH (ref 6–20)
Bilirubin Total: 0.4 mg/dL (ref 0.0–1.2)
CO2: 25 mmol/L (ref 20–29)
Calcium: 9.3 mg/dL (ref 8.7–10.2)
Chloride: 104 mmol/L (ref 96–106)
Creatinine, Ser: 1.18 mg/dL (ref 0.76–1.27)
Globulin, Total: 2.2 g/dL (ref 1.5–4.5)
Glucose: 85 mg/dL (ref 70–99)
Potassium: 4.2 mmol/L (ref 3.5–5.2)
Sodium: 143 mmol/L (ref 134–144)
Total Protein: 6.9 g/dL (ref 6.0–8.5)
eGFR: 80 mL/min/{1.73_m2} (ref >59)

## 2022-08-11 LAB — LP+LDL DIRECT
Cholesterol, Total: 138 mg/dL (ref 100–199)
HDL: 57 mg/dL (ref >39)
LDL Chol Calc (NIH): 71 mg/dL (ref 0–99)
LDL Direct: 78 mg/dL (ref 0–99)
Triglycerides: 44 mg/dL (ref 0–149)
VLDL Cholesterol Cal: 10 mg/dL (ref 5–40)

## 2022-08-11 LAB — CBC WITH DIFFERENTIAL/PLATELET
Basophils Absolute: 0.1 10*3/uL (ref 0.0–0.2)
Basos: 1 %
EOS (ABSOLUTE): 0.1 10*3/uL (ref 0.0–0.4)
Eos: 2 %
Hematocrit: 50.9 % (ref 37.5–51.0)
Hemoglobin: 17.1 g/dL (ref 13.0–17.7)
Immature Grans (Abs): 0 10*3/uL (ref 0.0–0.1)
Immature Granulocytes: 0 %
Lymphocytes Absolute: 1.6 10*3/uL (ref 0.7–3.1)
Lymphs: 21 %
MCH: 31.4 pg (ref 26.6–33.0)
MCHC: 33.6 g/dL (ref 31.5–35.7)
MCV: 93 fL (ref 79–97)
Monocytes Absolute: 0.7 10*3/uL (ref 0.1–0.9)
Monocytes: 10 %
Neutrophils Absolute: 5 10*3/uL (ref 1.4–7.0)
Neutrophils: 66 %
Platelets: 220 10*3/uL (ref 150–450)
RBC: 5.45 x10E6/uL (ref 4.14–5.80)
RDW: 11.3 % — ABNORMAL LOW (ref 11.6–15.4)
WBC: 7.4 10*3/uL (ref 3.4–10.8)

## 2022-08-11 LAB — HEMOGLOBIN A1C
Est. average glucose Bld gHb Est-mCnc: 88 mg/dL
Hgb A1c MFr Bld: 4.7 % — ABNORMAL LOW (ref 4.8–5.6)

## 2022-08-11 LAB — VITAMIN D 25 HYDROXY (VIT D DEFICIENCY, FRACTURES): Vit D, 25-Hydroxy: 70.3 ng/mL (ref 30.0–100.0)

## 2022-08-12 ENCOUNTER — Encounter (HOSPITAL_BASED_OUTPATIENT_CLINIC_OR_DEPARTMENT_OTHER): Payer: Self-pay | Admitting: Nurse Practitioner

## 2022-09-09 ENCOUNTER — Encounter (HOSPITAL_BASED_OUTPATIENT_CLINIC_OR_DEPARTMENT_OTHER): Payer: Self-pay

## 2022-09-15 NOTE — Assessment & Plan Note (Signed)

## 2022-09-24 ENCOUNTER — Telehealth: Payer: 59 | Admitting: Emergency Medicine

## 2022-09-24 DIAGNOSIS — R059 Cough, unspecified: Secondary | ICD-10-CM

## 2022-09-24 NOTE — Progress Notes (Signed)
Because you were sick with similar symptoms 2 months ago, and because it does not sound like you currently have a bacterial infection but that you would like to prevent one, I feel your condition warrants further evaluation and I recommend that you be seen for a face to face visit.  Please contact your primary care physician practice to be seen. Or you can be seen in person at an Urgent Care.    NOTE: There will be NO CHARGE for this eVisit   If you are having a true medical emergency please call 911.      For an urgent face to face visit, [facility] has seven urgent care centers for your convenience:     [facility] at  Get Driving Directions [phone] [address] [place], [place] [address]    [facility]) Get Driving Directions [phone] [address] [place], [place] [address]  [facility] [place] ([place]) Get Driving Directions [phone] [address] [place],  [place]  [address]  [facility] - at [address] Get Driving Directions  [phone] [address] [place],  [place] [address]   [facility] Get Driving Directions [phone] [address], [place], [place] [address]   [facility] Get Driving Directions  [phone] [address] [place], [place] [address]   [facility] [place] Get Driving Directions [phone] [address], [place], [place] [address]  Your MyChart E-visit questionnaire answers were reviewed by a board certified advanced clinical practitioner to complete your personal care plan based on your specific symptoms.  Thank you for using e-Visits.

## 2022-10-13 ENCOUNTER — Telehealth: Payer: Commercial Managed Care - PPO | Admitting: Family Medicine

## 2022-10-13 DIAGNOSIS — J019 Acute sinusitis, unspecified: Secondary | ICD-10-CM

## 2022-10-13 DIAGNOSIS — B9689 Other specified bacterial agents as the cause of diseases classified elsewhere: Secondary | ICD-10-CM | POA: Diagnosis not present

## 2022-10-13 DIAGNOSIS — R051 Acute cough: Secondary | ICD-10-CM | POA: Diagnosis not present

## 2022-10-13 MED ORDER — PREDNISONE 20 MG PO TABS: 40.0000 mg | ORAL_TABLET | Freq: Every day | ORAL | 0 refills | Status: AC

## 2022-10-13 MED ORDER — BENZONATATE 100 MG PO CAPS: 100.0000 mg | ORAL_CAPSULE | Freq: Two times a day (BID) | ORAL | 0 refills | Status: DC | PRN

## 2022-10-13 MED ORDER — AMOXICILLIN-POT CLAVULANATE 875-125 MG PO TABS: 1.0000 | ORAL_TABLET | Freq: Two times a day (BID) | ORAL | 0 refills | Status: AC

## 2022-10-13 MED ORDER — PROMETHAZINE-DM 6.25-15 MG/5ML PO SYRP: 5.0000 mL | ORAL_SOLUTION | Freq: Three times a day (TID) | ORAL | 0 refills | Status: DC | PRN

## 2022-10-13 NOTE — Progress Notes (Signed)
Virtual Visit Consent   [name], you are scheduled for a virtual visit with a [facility] provider today. Just as with appointments in the office, your consent must be obtained to participate. Your consent will be active for this visit and any virtual visit you may have with one of our providers in the next 365 days. If you have a MyChart account, a copy of this consent can be sent to you electronically.  As this is a virtual visit, video technology does not allow for your provider to perform a traditional examination. This may limit your provider's ability to fully assess your condition. If your provider identifies any concerns that need to be evaluated in person or the need to arrange testing (such as labs, EKG, etc.), we will make arrangements to do so. Although advances in technology are sophisticated, we cannot ensure that it will always work on either your end or our end. If the connection with a video visit is poor, the visit may have to be switched to a telephone visit. With either a video or telephone visit, we are not always able to ensure that we have a secure connection.  By engaging in this virtual visit, you consent to the provision of healthcare and authorize for your insurance to be billed (if applicable) for the services provided during this visit. Depending on your insurance coverage, you may receive a charge related to this service.  I need to obtain your verbal consent now. Are you willing to proceed with your visit today? Douglas Sanford has provided verbal consent on 10/13/2022 for a virtual visit (video or telephone). Perlie Mayo, NP  Date: 10/13/2022 11:42 AM  Virtual Visit via Video Note   I, Perlie Mayo, connected with  Douglas Sanford  ([id number], 04/16/1981) on 10/13/22 at 12:15 PM EST by a video-enabled telemedicine application and verified that I am speaking with the correct person using two identifiers.  Location: Patient: Virtual Visit Location Patient:  Home Provider: Virtual Visit Location Provider: Home Office   I discussed the limitations of evaluation and management by telemedicine and the availability of in person appointments. The patient expressed understanding and agreed to proceed.    History of Present Illness: Douglas Sanford is a 42 y.o. who identifies as a male who was assigned male at birth, and is being seen today for a persistent cough that comes and goes going on for the last two weeks. Household had a virus- known exposure to sick contacts. Associated symptoms include a productive cough, yellow mucus, facial pressure- nasal congestion when laying flat- having a hard time sleeping. Denies fever, chest pain, shortness of breath, sore throat, ear pain. Modifying factors include tried day quil, decongestant, Perles.History of sinus infection causing chest congestion. All family members tested negative for flu, covid, and rsv.    Problems:  Patient Active Problem List   Diagnosis Date Noted   Acute bilateral low back pain without sciatica 05/07/2022   Screening for endocrine, nutritional, metabolic and immunity disorder 08/09/2021   Screening for lipid disorders 08/09/2021   Screening for deficiency anemia 08/09/2021   Encounter for annual physical exam 08/09/2021    Allergies: No Known Allergies Medications:  Current Outpatient Medications:    anastrozole (ARIMIDEX) 1 MG tablet, Take 1 mg by mouth daily., Disp: , Rfl:   Observations/Objective: Patient is well-developed, well-nourished in no acute distress.  Resting comfortably  at home.  Head is normocephalic, atraumatic.  No labored breathing.  Speech is  clear and coherent with logical content.  Patient is alert and oriented at baseline.  Cough appreciated   Assessment and Plan: 1. Acute bacterial sinusitis  - amoxicillin-clavulanate (AUGMENTIN) 875-125 MG tablet; Take 1 tablet by mouth 2 (two) times daily for 7 days.  Dispense: 14 tablet; Refill: 0 - predniSONE  (DELTASONE) 20 MG tablet; Take 2 tablets (40 mg total) by mouth daily with breakfast for 5 days.  Dispense: 10 tablet; Refill: 0 - benzonatate (TESSALON) 100 MG capsule; Take 1 capsule (100 mg total) by mouth 2 (two) times daily as needed for cough.  Dispense: 20 capsule; Refill: 0  2. Acute cough  - predniSONE (DELTASONE) 20 MG tablet; Take 2 tablets (40 mg total) by mouth daily with breakfast for 5 days.  Dispense: 10 tablet; Refill: 0 - benzonatate (TESSALON) 100 MG capsule; Take 1 capsule (100 mg total) by mouth 2 (two) times daily as needed for cough.  Dispense: 20 capsule; Refill: 0 - promethazine-dextromethorphan (PROMETHAZINE-DM) 6.25-15 MG/5ML syrup; Take 5 mLs by mouth 3 (three) times daily as needed for cough.  Dispense: 118 mL; Refill: 0  -Take meds as prescribed -Rest -Use a cool mist humidifier especially during the winter months when heat dries out the air. - Use saline nose sprays frequently to help soothe nasal passages and promote drainage. -stay hydrated by drinking plenty of fluids - Keep thermostat turn down low to prevent drying out sinuses - For any cough or congestion- robitussin DM or Delsym as needed - For fever or aches or pains- take tylenol or ibuprofen as directed on bottle             * for fevers greater than 101 orally you may alternate ibuprofen and tylenol every 3 hours.  If you do not improve you will need a follow up visit in person.                 Reviewed side effects, risks and benefits of medication.    Patient acknowledged agreement and understanding of the plan.   Past Medical, Surgical, Social History, Allergies, and Medications have been Reviewed.     Follow Up Instructions: I discussed the assessment and treatment plan with the patient. The patient was provided an opportunity to ask questions and all were answered. The patient agreed with the plan and demonstrated an understanding of the instructions.  A copy of instructions were sent to  the patient via MyChart unless otherwise noted below.     The patient was advised to call back or seek an in-person evaluation if the symptoms worsen or if the condition fails to improve as anticipated.  Time:  I spent 10 minutes with the patient via telehealth technology discussing the above problems/concerns.    Perlie Mayo, NP

## 2022-10-13 NOTE — Patient Instructions (Signed)
[name], thank you for joining Perlie Mayo, NP for today's virtual visit.  While this provider is not your primary care provider (PCP), if your PCP is located in our provider database this encounter information will be shared with them immediately following your visit.   [facility] account gives you access to today's visit and all your visits, tests, and labs performed at [facility] " click here if you don't have a [facility] account or go to [email or web address]  Consent: (Patient) Douglas Sanford provided verbal consent for this virtual visit at the beginning of the encounter.  Current Medications:  Current Outpatient Medications:    amoxicillin-clavulanate (AUGMENTIN) 875-125 MG tablet, Take 1 tablet by mouth 2 (two) times daily for 7 days., Disp: 14 tablet, Rfl: 0   benzonatate (TESSALON) 100 MG capsule, Take 1 capsule (100 mg total) by mouth 2 (two) times daily as needed for cough., Disp: 20 capsule, Rfl: 0   predniSONE (DELTASONE) 20 MG tablet, Take 2 tablets (40 mg total) by mouth daily with breakfast for 5 days., Disp: 10 tablet, Rfl: 0   promethazine-dextromethorphan (PROMETHAZINE-DM) 6.25-15 MG/5ML syrup, Take 5 mLs by mouth 3 (three) times daily as needed for cough., Disp: 118 mL, Rfl: 0   anastrozole (ARIMIDEX) 1 MG tablet, Take 1 mg by mouth daily., Disp: , Rfl:    Medications ordered in this encounter:  Meds ordered this encounter  Medications   amoxicillin-clavulanate (AUGMENTIN) 875-125 MG tablet    Sig: Take 1 tablet by mouth 2 (two) times daily for 7 days.    Dispense:  14 tablet    Refill:  0    Order Specific Question:   Supervising Provider    Answer:   Chase Picket [[id number]]   predniSONE (DELTASONE) 20 MG tablet    Sig: Take 2 tablets (40 mg total) by mouth daily with breakfast for 5 days.    Dispense:  10 tablet    Refill:  0    Order Specific Question:   Supervising Provider    Answer:   Chase Picket  [[id number]]   benzonatate (TESSALON) 100 MG capsule    Sig: Take 1 capsule (100 mg total) by mouth 2 (two) times daily as needed for cough.    Dispense:  20 capsule    Refill:  0    Order Specific Question:   Supervising Provider    Answer:   Chase Picket [id number]   promethazine-dextromethorphan (PROMETHAZINE-DM) 6.25-15 MG/5ML syrup    Sig: Take 5 mLs by mouth 3 (three) times daily as needed for cough.    Dispense:  118 mL    Refill:  0    Order Specific Question:   Supervising Provider    Answer:   Chase Picket [[id number]]     *If you need refills on other medications prior to your next appointment, please contact your pharmacy*  Follow-Up: Call back or seek an in-person evaluation if the symptoms worsen or if the condition fails to improve as anticipated.  [facility] [phone]  Other Instructions  - Take meds as prescribed - Rest voice - Use a cool mist humidifier especially during the winter months when heat dries out the air. - Use saline nose sprays frequently to help soothe nasal passages if they are drying out. - Stay hydrated by drinking plenty of fluids - Keep thermostat turn down low to prevent drying out which can cause a dry cough. - For  any cough or congestion- robitussin DM or Delsym as needed - For fever or aches or pains- take tylenol or ibuprofen as directed on bottle             * for fevers greater than 101 orally you may alternate ibuprofen and tylenol every 3 hours.  If you do not improve you will need a follow up visit in person.                   If you have been instructed to have an in-person evaluation today at a local Urgent Care facility, please use the link below. It will take you to a list of all of our available [facility], including address, phone number and hours of operation. Please do not delay care.  [facility]  If you or a family member do not have a primary care provider, use the  link below to schedule a visit and establish care. When you choose a [facility] primary care physician or advanced practice provider, you gain a long-term partner in health. Find a Primary Care Provider  Learn more about [facility] in-office and virtual care options: [facility]

## 2023-01-18 ENCOUNTER — Encounter (HOSPITAL_BASED_OUTPATIENT_CLINIC_OR_DEPARTMENT_OTHER): Payer: Self-pay | Admitting: Family Medicine

## 2023-08-12 ENCOUNTER — Ambulatory Visit (INDEPENDENT_AMBULATORY_CARE_PROVIDER_SITE_OTHER): Payer: Commercial Managed Care - PPO | Admitting: Family Medicine

## 2023-08-12 ENCOUNTER — Encounter (HOSPITAL_BASED_OUTPATIENT_CLINIC_OR_DEPARTMENT_OTHER): Payer: Self-pay | Admitting: Family Medicine

## 2023-08-12 VITALS — BP 156/102 | HR 116 | Ht 71.5 in | Wt 195.7 lb

## 2023-08-12 DIAGNOSIS — E559 Vitamin D deficiency, unspecified: Secondary | ICD-10-CM | POA: Diagnosis not present

## 2023-08-12 DIAGNOSIS — Z0000 Encounter for general adult medical examination without abnormal findings: Secondary | ICD-10-CM | POA: Diagnosis not present

## 2023-08-12 MED ORDER — MELOXICAM 15 MG PO TABS: 15.0000 mg | ORAL_TABLET | Freq: Every day | ORAL | 1 refills | Status: DC

## 2023-08-12 NOTE — Assessment & Plan Note (Signed)
Routine HCM labs ordered. HCM reviewed/discussed. Anticipatory guidance regarding healthy weight, lifestyle and choices given. Recommend healthy diet.  Recommend approximately 150 minutes/week of moderate intensity exercise Recommend regular dental and vision exams Always use seatbelt/lap and shoulder restraints Recommend using smoke alarms and checking batteries at least twice a year Recommend using sunscreen when outside Discussed tetanus immunization recommendations, patient will check with [facility] to see when last tetanus was received through them.

## 2023-08-12 NOTE — Progress Notes (Addendum)
Subjective:    CC: Annual Physical Exam  HPI: Douglas Sanford is a 42 y.o. presenting for annual physical  I reviewed the past medical history, family history, social history, surgical history, and allergies today and no changes were needed.  Please see the problem list section below in epic for further details.  Past Medical History: Past Medical History:  Diagnosis Date   Low back pain    Past Surgical History: Past Surgical History:  Procedure Laterality Date   BACK SURGERY     Social History: Social History   Socioeconomic History   Marital status: Married    Spouse name: Not on file   Number of children: 2   Years of education: Not on file   Highest education level: Not on file  Occupational History   Not on file  Tobacco Use   Smoking status: Never    Passive exposure: Never   Smokeless tobacco: Never  Vaping Use   Vaping status: Never Used  Substance and Sexual Activity   Alcohol use: Not Currently   Drug use: Never   Sexual activity: Not on file  Other Topics Concern   Not on file  Social History Narrative   30 month old son and 77 year old daughter.    Social Determinants of Health   Financial Resource Strain: Not on file  Food Insecurity: Not on file  Transportation Needs: Not on file  Physical Activity: Not on file  Stress: Not on file  Social Connections: Not on file   Family History: History reviewed. No pertinent family history. Allergies: No Known Allergies Medications: See med rec.  Review of Systems: No headache, visual changes, nausea, vomiting, diarrhea, constipation, dizziness, abdominal pain, skin rash, fevers, chills, night sweats, swollen lymph nodes, weight loss, chest pain, body aches, joint swelling, muscle aches, shortness of breath, mood changes, visual or auditory hallucinations.  Objective:    BP (!) 156/102 (BP Location: Right Arm, Patient Position: Sitting, Cuff Size: Normal)   Pulse (!) 116   Ht 5' 11.5" (1.816 m)   Wt  195 lb 11.2 oz (88.8 kg)   SpO2 99%   BMI 26.91 kg/m   General: Well Developed, well nourished, and in no acute distress. Neuro: Alert and oriented x3, extra-ocular muscles intact, sensation grossly intact. Cranial nerves II through XII are intact, motor, sensory, and coordinative functions are all intact. HEENT: Normocephalic, atraumatic, pupils equal round reactive to light, neck supple, no masses, no lymphadenopathy, thyroid nonpalpable. Oropharynx, nasopharynx, external ear canals are unremarkable. Skin: Warm and dry, no rashes noted.  Cardiac: Regular rate and rhythm, no murmurs rubs or gallops. Respiratory: Clear to auscultation bilaterally. Not using accessory muscles, speaking in full sentences. Abdominal: Soft, nontender, nondistended, positive bowel sounds, no masses, no organomegaly. Musculoskeletal: Shoulder, elbow, wrist, hip, knee, ankle stable, and with full range of motion.  Impression and Recommendations:    Wellness examination Assessment & Plan: Routine HCM labs ordered. HCM reviewed/discussed. Anticipatory guidance regarding healthy weight, lifestyle and choices given. Recommend healthy diet.  Recommend approximately 150 minutes/week of moderate intensity exercise Recommend regular dental and vision exams Always use seatbelt/lap and shoulder restraints Recommend using smoke alarms and checking batteries at least twice a year Recommend using sunscreen when outside Discussed tetanus immunization recommendations, patient will check with [facility] to see when last tetanus was received through them.  Orders: -     CBC with Differential/Platelet -     Comprehensive metabolic panel -     Lipid panel -  TSH Rfx on Abnormal to Free T4 -     Hemoglobin A1c -     VITAMIN D 25 Hydroxy (Vit-D Deficiency, Fractures)  Vitamin D deficiency -     VITAMIN D 25 Hydroxy (Vit-D Deficiency, Fractures)  Other orders -     Meloxicam; Take 1 tablet (15 mg total) by mouth daily.   Dispense: 30 tablet; Refill: 1  Blood pressure elevated in office today.  Has been borderline in the past.  He reports that he will check blood pressure occasionally at work and at home and typically it is in the 120 systolic and 80s diastolic.  He thinks that it is often elevated in doctors offices.  Blood pressure slightly improved on recheck.  Will have patient monitor blood pressure at home and send [place] a MyChart message with blood pressure log for monitoring.  He will also return for nurse visit in about 2 months for monitoring.  Return in about 1 year (around 08/11/2024) for CPE.   ___________________________________________ Bodin Gorka de [place], MD, ABFM, [facility]

## 2023-08-13 LAB — LIPID PANEL
Chol/HDL Ratio: 2.7 ratio (ref 0.0–5.0)
Cholesterol, Total: 159 mg/dL (ref 100–199)
HDL: 59 mg/dL (ref >39)
LDL Chol Calc (NIH): 88 mg/dL (ref 0–99)
Triglycerides: 57 mg/dL (ref 0–149)
VLDL Cholesterol Cal: 12 mg/dL (ref 5–40)

## 2023-08-13 LAB — CBC WITH DIFFERENTIAL/PLATELET
Basophils Absolute: 0.1 10*3/uL (ref 0.0–0.2)
Basos: 1 %
EOS (ABSOLUTE): 0.1 10*3/uL (ref 0.0–0.4)
Eos: 1 %
Hematocrit: 52.4 % — ABNORMAL HIGH (ref 37.5–51.0)
Hemoglobin: 17.9 g/dL — ABNORMAL HIGH (ref 13.0–17.7)
Immature Grans (Abs): 0 10*3/uL (ref 0.0–0.1)
Immature Granulocytes: 0 %
Lymphocytes Absolute: 1.6 10*3/uL (ref 0.7–3.1)
Lymphs: 20 %
MCH: 30.9 pg (ref 26.6–33.0)
MCHC: 34.2 g/dL (ref 31.5–35.7)
MCV: 90 fL (ref 79–97)
Monocytes Absolute: 0.7 10*3/uL (ref 0.1–0.9)
Monocytes: 9 %
Neutrophils Absolute: 5.5 10*3/uL (ref 1.4–7.0)
Neutrophils: 69 %
Platelets: 229 10*3/uL (ref 150–450)
RBC: 5.8 x10E6/uL (ref 4.14–5.80)
RDW: 12.5 % (ref 11.6–15.4)
WBC: 8 10*3/uL (ref 3.4–10.8)

## 2023-08-13 LAB — COMPREHENSIVE METABOLIC PANEL
ALT: 32 [IU]/L (ref 0–44)
AST: 29 [IU]/L (ref 0–40)
Albumin: 4.6 g/dL (ref 4.1–5.1)
Alkaline Phosphatase: 51 [IU]/L (ref 44–121)
BUN/Creatinine Ratio: 18 (ref 9–20)
BUN: 21 mg/dL (ref 6–24)
Bilirubin Total: 0.5 mg/dL (ref 0.0–1.2)
CO2: 27 mmol/L (ref 20–29)
Calcium: 9.9 mg/dL (ref 8.7–10.2)
Chloride: 100 mmol/L (ref 96–106)
Creatinine, Ser: 1.2 mg/dL (ref 0.76–1.27)
Globulin, Total: 2.5 g/dL (ref 1.5–4.5)
Glucose: 77 mg/dL (ref 70–99)
Potassium: 4.2 mmol/L (ref 3.5–5.2)
Sodium: 139 mmol/L (ref 134–144)
Total Protein: 7.1 g/dL (ref 6.0–8.5)
eGFR: 78 mL/min/{1.73_m2} (ref >59)

## 2023-08-13 LAB — HEMOGLOBIN A1C
Est. average glucose Bld gHb Est-mCnc: 103 mg/dL
Hgb A1c MFr Bld: 5.2 % (ref 4.8–5.6)

## 2023-08-13 LAB — TSH RFX ON ABNORMAL TO FREE T4: TSH: 1.52 u[IU]/mL (ref 0.450–4.500)

## 2023-08-13 LAB — VITAMIN D 25 HYDROXY (VIT D DEFICIENCY, FRACTURES): Vit D, 25-Hydroxy: 46.8 ng/mL (ref 30.0–100.0)

## 2023-08-17 ENCOUNTER — Encounter (HOSPITAL_BASED_OUTPATIENT_CLINIC_OR_DEPARTMENT_OTHER): Payer: Self-pay | Admitting: Family Medicine

## 2023-08-26 ENCOUNTER — Ambulatory Visit (HOSPITAL_BASED_OUTPATIENT_CLINIC_OR_DEPARTMENT_OTHER): Payer: Commercial Managed Care - PPO

## 2023-09-30 DIAGNOSIS — X32XXXA Exposure to sunlight, initial encounter: Secondary | ICD-10-CM | POA: Diagnosis not present

## 2023-09-30 DIAGNOSIS — L570 Actinic keratosis: Secondary | ICD-10-CM | POA: Diagnosis not present

## 2023-09-30 DIAGNOSIS — D2261 Melanocytic nevi of right upper limb, including shoulder: Secondary | ICD-10-CM | POA: Diagnosis not present

## 2023-09-30 DIAGNOSIS — D485 Neoplasm of uncertain behavior of skin: Secondary | ICD-10-CM | POA: Diagnosis not present

## 2023-09-30 DIAGNOSIS — D225 Melanocytic nevi of trunk: Secondary | ICD-10-CM | POA: Diagnosis not present

## 2023-09-30 DIAGNOSIS — D2262 Melanocytic nevi of left upper limb, including shoulder: Secondary | ICD-10-CM | POA: Diagnosis not present

## 2023-09-30 DIAGNOSIS — D2239 Melanocytic nevi of other parts of face: Secondary | ICD-10-CM | POA: Diagnosis not present

## 2023-12-02 ENCOUNTER — Telehealth: Payer: Commercial Managed Care - PPO | Admitting: Physician Assistant

## 2023-12-02 ENCOUNTER — Other Ambulatory Visit: Payer: Self-pay

## 2023-12-02 DIAGNOSIS — J208 Acute bronchitis due to other specified organisms: Secondary | ICD-10-CM | POA: Diagnosis not present

## 2023-12-02 MED ORDER — ALBUTEROL SULFATE HFA 108 (90 BASE) MCG/ACT IN AERS
1.0000 | INHALATION_SPRAY | Freq: Four times a day (QID) | RESPIRATORY_TRACT | 0 refills | Status: DC | PRN
  Filled 2023-12-02: qty 6.7, 25d supply, fill #0

## 2023-12-02 MED ORDER — AZITHROMYCIN 250 MG PO TABS
ORAL_TABLET | ORAL | 0 refills | Status: AC
  Filled 2023-12-02: qty 6, 5d supply, fill #0

## 2023-12-02 MED ORDER — PROMETHAZINE-DM 6.25-15 MG/5ML PO SYRP
5.0000 mL | ORAL_SOLUTION | Freq: Four times a day (QID) | ORAL | 0 refills | Status: DC | PRN
  Filled 2023-12-02: qty 118, 6d supply, fill #0

## 2023-12-02 MED ORDER — PREDNISONE 10 MG PO TABS
ORAL_TABLET | ORAL | 0 refills | Status: DC
  Filled 2023-12-02: qty 21, 6d supply, fill #0

## 2023-12-02 MED ORDER — BENZONATATE 100 MG PO CAPS
100.0000 mg | ORAL_CAPSULE | Freq: Three times a day (TID) | ORAL | 0 refills | Status: DC | PRN
  Filled 2023-12-02: qty 30, 5d supply, fill #0

## 2023-12-02 NOTE — Progress Notes (Signed)
 Virtual Visit Consent   Douglas Sanford, you are scheduled for a virtual visit with a [facility] provider today. Just as with appointments in the office, your consent must be obtained to participate. Your consent will be active for this visit and any virtual visit you may have with one of our providers in the next 365 days. If you have a MyChart account, a copy of this consent can be sent to you electronically.  As this is a virtual visit, video technology does not allow for your provider to perform a traditional examination. This may limit your provider's ability to fully assess your condition. If your provider identifies any concerns that need to be evaluated in person or the need to arrange testing (such as labs, EKG, etc.), we will make arrangements to do so. Although advances in technology are sophisticated, we cannot ensure that it will always work on either your end or our end. If the connection with a video visit is poor, the visit may have to be switched to a telephone visit. With either a video or telephone visit, we are not always able to ensure that we have a secure connection.  By engaging in this virtual visit, you consent to the provision of healthcare and authorize for your insurance to be billed (if applicable) for the services provided during this visit. Depending on your insurance coverage, you may receive a charge related to this service.  I need to obtain your verbal consent now. Are you willing to proceed with your visit today? Douglas Sanford has provided verbal consent on 12/02/2023 for a virtual visit (video or telephone). Margaretann Loveless, PA-C  Date: 12/02/2023 12:06 PM   Virtual Visit via Video Note   I, Margaretann Loveless, connected with  Douglas Sanford  ([id number], [birth year]) on 12/02/23 at 12:00 PM EST by a video-enabled telemedicine application and verified that I am speaking with the correct person using two identifiers.  Location: Patient: Virtual Visit Location  Patient: Home Provider: Virtual Visit Location Provider: Home Office   I discussed the limitations of evaluation and management by telemedicine and the availability of in person appointments. The patient expressed understanding and agreed to proceed.    History of Present Illness: Douglas Sanford is a 43 y.o. who identifies as a male who was assigned male at birth, and is being seen today for cough and congestion.  HPI: URI  This is a new problem. The current episode started in the past 7 days (4 days). The problem has been gradually worsening. The maximum temperature recorded prior to his arrival was 101 - 101.9 F (spiking fevers overnight; chills and sweats; fever 101 this morning). Associated symptoms include chest pain, congestion, coughing, headaches, rhinorrhea (and post nasal drainage), sinus pain, a sore throat and wheezing. Pertinent negatives include no diarrhea, ear pain, nausea, plugged ear sensation or swollen glands. Associated symptoms comments: Hoarse voice. Treatments tried: dayquil, nyquil, mucinex. The treatment provided no relief.   Thick mucus that he can get up with effort.   Problems:  Patient Active Problem List   Diagnosis Date Noted   Acute bilateral low back pain without sciatica 05/07/2022   Screening for endocrine, nutritional, metabolic and immunity disorder 08/09/2021   Screening for lipid disorders 08/09/2021   Screening for deficiency anemia 08/09/2021   Wellness examination 08/09/2021    Allergies: No Known Allergies Medications:  Current Outpatient Medications:    albuterol (VENTOLIN HFA) 108 (90 Base) MCG/ACT inhaler, Inhale 1-2 puffs into  the lungs every 6 (six) hours as needed., Disp: 8 g, Rfl: 0   azithromycin (ZITHROMAX) 250 MG tablet, Take 2 tablets on day 1, then 1 tablet daily on days 2 through 5, Disp: 6 tablet, Rfl: 0   benzonatate (TESSALON) 100 MG capsule, Take 1-2 capsules (100-200 mg total) by mouth 3 (three) times daily as needed., Disp: 30  capsule, Rfl: 0   predniSONE (STERAPRED UNI-PAK 21 TAB) 10 MG (21) TBPK tablet, 6 day taper; take as directed on package instructions, Disp: 21 tablet, Rfl: 0   promethazine-dextromethorphan (PROMETHAZINE-DM) 6.25-15 MG/5ML syrup, Take 5 mLs by mouth 4 (four) times daily as needed., Disp: 118 mL, Rfl: 0   anastrozole (ARIMIDEX) 1 MG tablet, Take 1 mg by mouth daily., Disp: , Rfl:    meloxicam (MOBIC) 15 MG tablet, Take 1 tablet (15 mg total) by mouth daily., Disp: 30 tablet, Rfl: 1   Testosterone Cypionate 200 MG/ML SOLN, , Disp: , Rfl:   Observations/Objective: Patient is well-developed, well-nourished in no acute distress.  Resting comfortably at home.  Head is normocephalic, atraumatic.  No labored breathing.  Speech is clear and coherent with logical content.  Patient is alert and oriented at baseline.    Assessment and Plan: 1. Acute viral bronchitis (Primary) - azithromycin (ZITHROMAX) 250 MG tablet; Take 2 tablets on day 1, then 1 tablet daily on days 2 through 5  Dispense: 6 tablet; Refill: 0 - predniSONE (STERAPRED UNI-PAK 21 TAB) 10 MG (21) TBPK tablet; 6 day taper; take as directed on package instructions  Dispense: 21 tablet; Refill: 0 - promethazine-dextromethorphan (PROMETHAZINE-DM) 6.25-15 MG/5ML syrup; Take 5 mLs by mouth 4 (four) times daily as needed.  Dispense: 118 mL; Refill: 0 - benzonatate (TESSALON) 100 MG capsule; Take 1-2 capsules (100-200 mg total) by mouth 3 (three) times daily as needed.  Dispense: 30 capsule; Refill: 0 - albuterol (VENTOLIN HFA) 108 (90 Base) MCG/ACT inhaler; Inhale 1-2 puffs into the lungs every 6 (six) hours as needed.  Dispense: 8 g; Refill: 0   - Worsening over a week despite OTC medications - Prednisone for chest tightness and inflammation of airway - Albuterol for wheezing, chest tightness, and/or shortness of breath - Will treat cough with Promethazine DM and tessalon perles - Can continue Mucinex (PLAIN) - Azithromycin prescribed but  advised to wait 24-48 hours before starting to see if symptoms will start to improve on their own with other treatment - Push fluids.  - Rest.  - Steam and humidifier can help - Seek in person evaluation if worsening or symptoms fail to improve    Follow Up Instructions: I discussed the assessment and treatment plan with the patient. The patient was provided an opportunity to ask questions and all were answered. The patient agreed with the plan and demonstrated an understanding of the instructions.  A copy of instructions were sent to the patient via MyChart unless otherwise noted below.    The patient was advised to call back or seek an in-person evaluation if the symptoms worsen or if the condition fails to improve as anticipated.    Margaretann Loveless, PA-C

## 2023-12-02 NOTE — Patient Instructions (Signed)
 [name], thank you for joining Margaretann Loveless, PA-C for today's virtual visit.  While this provider is not your primary care provider (PCP), if your PCP is located in our provider database this encounter information will be shared with them immediately following your visit.   A [facility] MyChart account gives you access to today's visit and all your visits, tests, and labs performed at [facility] " click here if you don't have a [facility] MyChart account or go to [email or web address]  Consent: (Patient) Douglas Sanford provided verbal consent for this virtual visit at the beginning of the encounter.  Current Medications:  Current Outpatient Medications:    albuterol (VENTOLIN HFA) 108 (90 Base) MCG/ACT inhaler, Inhale 1-2 puffs into the lungs every 6 (six) hours as needed., Disp: 8 g, Rfl: 0   azithromycin (ZITHROMAX) 250 MG tablet, Take 2 tablets on day 1, then 1 tablet daily on days 2 through 5, Disp: 6 tablet, Rfl: 0   benzonatate (TESSALON) 100 MG capsule, Take 1-2 capsules (100-200 mg total) by mouth 3 (three) times daily as needed., Disp: 30 capsule, Rfl: 0   predniSONE (STERAPRED UNI-PAK 21 TAB) 10 MG (21) TBPK tablet, 6 day taper; take as directed on package instructions, Disp: 21 tablet, Rfl: 0   promethazine-dextromethorphan (PROMETHAZINE-DM) 6.25-15 MG/5ML syrup, Take 5 mLs by mouth 4 (four) times daily as needed., Disp: 118 mL, Rfl: 0   anastrozole (ARIMIDEX) 1 MG tablet, Take 1 mg by mouth daily., Disp: , Rfl:    meloxicam (MOBIC) 15 MG tablet, Take 1 tablet (15 mg total) by mouth daily., Disp: 30 tablet, Rfl: 1   Testosterone Cypionate 200 MG/ML SOLN, , Disp: , Rfl:    Medications ordered in this encounter:  Meds ordered this encounter  Medications   azithromycin (ZITHROMAX) 250 MG tablet    Sig: Take 2 tablets on day 1, then 1 tablet daily on days 2 through 5    Dispense:  6 tablet    Refill:  0    Supervising Provider:   Merrilee Jansky  [[id number]]   predniSONE (STERAPRED UNI-PAK 21 TAB) 10 MG (21) TBPK tablet    Sig: 6 day taper; take as directed on package instructions    Dispense:  21 tablet    Refill:  0    Supervising Provider:   Merrilee Jansky [[id number]]   promethazine-dextromethorphan (PROMETHAZINE-DM) 6.25-15 MG/5ML syrup    Sig: Take 5 mLs by mouth 4 (four) times daily as needed.    Dispense:  118 mL    Refill:  0    Supervising Provider:   Merrilee Jansky [[id number]]   benzonatate (TESSALON) 100 MG capsule    Sig: Take 1-2 capsules (100-200 mg total) by mouth 3 (three) times daily as needed.    Dispense:  30 capsule    Refill:  0    Supervising Provider:   Merrilee Jansky [[id number]]   albuterol (VENTOLIN HFA) 108 (90 Base) MCG/ACT inhaler    Sig: Inhale 1-2 puffs into the lungs every 6 (six) hours as needed.    Dispense:  8 g    Refill:  0    Supervising Provider:   Merrilee Jansky [[id number]]     *If you need refills on other medications prior to your next appointment, please contact your pharmacy*  Follow-Up: Call back or seek an in-person evaluation if the symptoms worsen or if the condition fails to improve as anticipated.  [facility] [phone])  [phone]  Other Instructions Acute Bronchitis, Adult  Acute bronchitis is sudden inflammation of the main airways (bronchi) that come off the windpipe (trachea) in the lungs. The swelling causes the airways to get smaller and make more mucus than normal. This can make it hard to breathe and can cause coughing or noisy breathing (wheezing). Acute bronchitis may last several weeks. The cough may last longer. Allergies, asthma, and exposure to smoke may make the condition worse. What are the causes? This condition can be caused by germs and by substances that irritate the lungs, including: Cold and flu viruses. The most common cause of this condition is the virus that causes the common cold. Bacteria. This is less common. Breathing in substances  that irritate the lungs, including: Smoke from cigarettes and other forms of tobacco. Dust and pollen. Fumes from household cleaning products, gases, or burned fuel. Indoor or outdoor air pollution. What increases the risk? The following factors may make you more likely to develop this condition: A weak body's defense system, also called the immune system. A condition that affects your lungs and breathing, such as asthma. What are the signs or symptoms? Common symptoms of this condition include: Coughing. This may bring up clear, yellow, or green mucus from your lungs (sputum). Wheezing. Runny or stuffy nose. Having too much mucus in your lungs (chest congestion). Shortness of breath. Aches and pains, including sore throat or chest. How is this diagnosed? This condition is usually diagnosed based on: Your symptoms and medical history. A physical exam. You may also have other tests, including tests to rule out other conditions, such as pneumonia. These tests include: A test of lung function. Test of a mucus sample to look for the presence of bacteria. Tests to check the oxygen level in your blood. Blood tests. Chest X-ray. How is this treated? Most cases of acute bronchitis clear up over time without treatment. Your health care provider may recommend: Drinking more fluids to help thin your mucus so it is easier to cough up. Taking inhaled medicine (inhaler) to improve air flow in and out of your lungs. Using a vaporizer or a humidifier. These are machines that add water to the air to help you breathe better. Taking a medicine that thins mucus and clears congestion (expectorant). Taking a medicine that prevents or stops coughing (cough suppressant). It is not common to take an antibiotic medicine for this condition. Follow these instructions at home:  Take over-the-counter and prescription medicines only as told by your health care provider. Use an inhaler, vaporizer, or  humidifier as told by your health care provider. Take two teaspoons (10 mL) of honey at bedtime to lessen coughing at night. Drink enough fluid to keep your urine pale yellow. Do not use any products that contain nicotine or tobacco. These products include cigarettes, chewing tobacco, and vaping devices, such as e-cigarettes. If you need help quitting, ask your health care provider. Get plenty of rest. Return to your normal activities as told by your health care provider. Ask your health care provider what activities are safe for you. Keep all follow-up visits. This is important. How is this prevented? To lower your risk of getting this condition again: Wash your hands often with soap and water for at least 20 seconds. If soap and water are not available, use hand sanitizer. Avoid contact with people who have cold symptoms. Try not to touch your mouth, nose, or eyes with your hands. Avoid breathing in smoke or chemical fumes. Breathing smoke  or chemical fumes will make your condition worse. Get the flu shot every year. Contact a health care provider if: Your symptoms do not improve after 2 weeks. You have trouble coughing up the mucus. Your cough keeps you awake at night. You have a fever. Get help right away if you: Cough up blood. Feel pain in your chest. Have severe shortness of breath. Faint or keep feeling like you are going to faint. Have a severe headache. Have a fever or chills that get worse. These symptoms may represent a serious problem that is an emergency. Do not wait to see if the symptoms will go away. Get medical help right away. Call your local emergency services (911 in the [place] Do not drive yourself to the hospital. Summary Acute bronchitis is inflammation of the main airways (bronchi) that come off the windpipe (trachea) in the lungs. The swelling causes the airways to get smaller and make more mucus than normal. Drinking more fluids can help thin your mucus so it  is easier to cough up. Take over-the-counter and prescription medicines only as told by your health care provider. Do not use any products that contain nicotine or tobacco. These products include cigarettes, chewing tobacco, and vaping devices, such as e-cigarettes. If you need help quitting, ask your health care provider. Contact a health care provider if your symptoms do not improve after 2 weeks. This information is not intended to replace advice given to you by your health care provider. Make sure you discuss any questions you have with your health care provider. Document Revised: 01/08/2022 Document Reviewed: 01/29/2021 Elsevier Patient Education  2024 Elsevier Inc.   If you have been instructed to have an in-person evaluation today at a local Urgent Care facility, please use the link below. It will take you to a list of all of our available [facility], including address, phone number and hours of operation. Please do not delay care.  [facility]  If you or a family member do not have a primary care provider, use the link below to schedule a visit and establish care. When you choose a [facility] primary care physician or advanced practice provider, you gain a long-term partner in health. Find a Primary Care Provider  Learn more about [facility] in-office and virtual care options: [facility] Get Care Now

## 2023-12-30 ENCOUNTER — Telehealth (HOSPITAL_BASED_OUTPATIENT_CLINIC_OR_DEPARTMENT_OTHER): Payer: Commercial Managed Care - PPO | Admitting: Family Medicine

## 2024-05-25 LAB — CBC AND DIFFERENTIAL
HCT: 53 (ref 41–53)
Hemoglobin: 17.7 — AB (ref 13.5–17.5)
Neutrophils Absolute: 5
Platelets: 241 K/uL (ref 150–400)
WBC: 7.1

## 2024-05-25 LAB — HEPATIC FUNCTION PANEL
ALT: 34 U/L (ref 10–40)
AST: 32 (ref 14–40)
Alkaline Phosphatase: 53 (ref 25–125)
Bilirubin, Total: 0.6

## 2024-05-25 LAB — COMPREHENSIVE METABOLIC PANEL WITH GFR
Albumin: 4.7 (ref 3.5–5.0)
Calcium: 9.8 (ref 8.7–10.7)
Globulin: 2.4
eGFR: 75

## 2024-05-25 LAB — BASIC METABOLIC PANEL WITH GFR
BUN: 15 (ref 4–21)
CO2: 23 — AB (ref 13–22)
Chloride: 100 (ref 99–108)
Creatinine: 1.2 (ref 0.6–1.3)
Glucose: 81
Potassium: 4.5 meq/L (ref 3.5–5.1)
Sodium: 140 (ref 137–147)

## 2024-05-25 LAB — TESTOSTERONE: Testosterone: 1425

## 2024-05-25 LAB — CBC: RBC: 5.72 — AB (ref 3.87–5.11)

## 2024-08-16 ENCOUNTER — Ambulatory Visit (INDEPENDENT_AMBULATORY_CARE_PROVIDER_SITE_OTHER): Payer: Commercial Managed Care - PPO | Admitting: Family Medicine

## 2024-08-16 ENCOUNTER — Encounter (HOSPITAL_BASED_OUTPATIENT_CLINIC_OR_DEPARTMENT_OTHER): Payer: Self-pay

## 2024-08-16 ENCOUNTER — Encounter (HOSPITAL_BASED_OUTPATIENT_CLINIC_OR_DEPARTMENT_OTHER): Payer: Self-pay | Admitting: Family Medicine

## 2024-08-16 ENCOUNTER — Other Ambulatory Visit (HOSPITAL_BASED_OUTPATIENT_CLINIC_OR_DEPARTMENT_OTHER): Payer: Self-pay

## 2024-08-16 VITALS — BP 158/89 | HR 105 | Temp 98.4°F | Resp 20 | Ht 71.5 in | Wt 193.0 lb

## 2024-08-16 DIAGNOSIS — Z0000 Encounter for general adult medical examination without abnormal findings: Secondary | ICD-10-CM

## 2024-08-16 DIAGNOSIS — R030 Elevated blood-pressure reading, without diagnosis of hypertension: Secondary | ICD-10-CM | POA: Insufficient documentation

## 2024-08-16 NOTE — Progress Notes (Signed)
 Subjective:    CC: Annual Physical Exam  HPI:  Douglas Sanford is a 43 y.o. presenting for annual physical  I reviewed the past medical history, family history, social history, surgical history, and allergies today and no changes were needed.  Please see the problem list section below in epic for further details.  Past Medical History: Past Medical History:  Diagnosis Date   Low back pain    Past Surgical History: Past Surgical History:  Procedure Laterality Date   BACK SURGERY     SPINE SURGERY     x 3 with fusion L-4/L-5   Social History: Social History   Socioeconomic History   Marital status: Married    Spouse name: Not on file   Number of children: 2   Years of education: Not on file   Highest education level: Associate degree: occupational, scientist, product/process development, or vocational program  Occupational History   Not on file  Tobacco Use   Smoking status: Never    Passive exposure: Never   Smokeless tobacco: Never  Vaping Use   Vaping status: Never Used  Substance and Sexual Activity   Alcohol use: Not Currently   Drug use: Never   Sexual activity: Yes    Comment: vasectomy  Other Topics Concern   Not on file  Social History Narrative   71 month old son and 46 year old daughter.    Social Drivers of Corporate Investment Banker Strain: Low Risk  (08/12/2024)   Overall Financial Resource Strain (CARDIA)    Difficulty of Paying Living Expenses: Not hard at all  Food Insecurity: No Food Insecurity (08/12/2024)   Hunger Vital Sign    Worried About Running Out of Food in the Last Year: Never true    Ran Out of Food in the Last Year: Never true  Transportation Needs: No Transportation Needs (08/12/2024)   PRAPARE - Administrator, Civil Service (Medical): No    Lack of Transportation (Non-Medical): No  Physical Activity: Sufficiently Active (08/12/2024)   Exercise Vital Sign    Days of Exercise per Week: 7 days    Minutes of Exercise per Session: 70 min  Stress:  Stress Concern Present (08/12/2024)   [employer] of Occupational Health - Occupational Stress Questionnaire    Feeling of Stress: To some extent  Social Connections: Moderately Isolated (08/12/2024)   Social Connection and Isolation Panel    Frequency of Communication with Friends and Family: More than three times a week    Frequency of Social Gatherings with Friends and Family: Once a week    Attends Religious Services: Never    Database Administrator or Organizations: No    Attends Engineer, Structural: Not on file    Marital Status: Living with partner   Family History: Family History  Problem Relation Age of Onset   Diabetes Mother    Diabetes Father    Hearing loss Father    Stroke Father    Vision loss Father    Heart disease Paternal Grandfather    Allergies: No Known Allergies Medications: See med rec.  Review of Systems: No headache, visual changes, nausea, vomiting, diarrhea, constipation, dizziness, abdominal pain, skin rash, fevers, chills, night sweats, swollen lymph nodes, weight loss, chest pain, body aches, joint swelling, muscle aches, shortness of breath, mood changes, visual or auditory hallucinations.  Objective:    BP (!) 158/89 (BP Location: Left Arm, Patient Position: Sitting)   Pulse (!) 105   Temp  98.4 F (36.9 C) (Oral)   Resp 20   Ht 5' 11.5 (1.816 m)   Wt 193 lb (87.5 kg)   SpO2 98%   BMI 26.54 kg/m   General: Well Developed, well nourished, and in no acute distress.  Neuro: Alert and oriented x3, extra-ocular muscles intact, sensation grossly intact. Cranial nerves II through XII are intact, motor, sensory, and coordinative functions are all intact. HEENT: Normocephalic, atraumatic, pupils equal round reactive to light, neck supple, no masses, no lymphadenopathy, thyroid nonpalpable. Oropharynx, nasopharynx, external ear canals are unremarkable. Skin: Warm and dry, no rashes noted.  Cardiac: Regular rate and rhythm, no murmurs  rubs or gallops.  Respiratory: Clear to auscultation bilaterally. Not using accessory muscles, speaking in full sentences.  Abdominal: Soft, nontender, nondistended, positive bowel sounds, no masses, no organomegaly. Musculoskeletal: Shoulder, elbow, wrist, hip, knee, ankle stable, and with full range of motion.  Impression and Recommendations:    Wellness examination Assessment & Plan: Routine HCM labs reviewed -patient has had these completed separately.  Most recent labs only included CMP and CBC.  No concerns with these labs.  Would recommend for cholesterol panel to be checked with next lab draw. HCM reviewed/discussed. Anticipatory guidance regarding healthy weight, lifestyle and choices given. Recommend healthy diet.  Recommend approximately 150 minutes/week of moderate intensity exercise Recommend regular dental and vision exams Always use seatbelt/lap and shoulder restraints Recommend using smoke alarms and checking batteries at least twice a year Recommend using sunscreen when outside Discussed immunization recommendations   Elevated blood pressure reading in office without diagnosis of hypertension Assessment & Plan: Blood pressure elevated in office today and was elevated at last office visit as well.  He reports that he does check his blood pressure at home and that typically at home is within normal range.  Denies any issues with headaches or chest pain. We discussed considerations, he we will continue with monitoring blood pressure at home, advised for him to send us  a MyChart message with recent readings sometime the next few weeks or so.  Would recommend continue to monitor blood pressure closely given elevation seen here in the office   Return in about 1 year (around 08/16/2025) for CPE.   ___________________________________________ Brecken W[place], MD, ABFM, CAQSM Primary Care and Sports Medicine [facility]

## 2024-08-16 NOTE — Assessment & Plan Note (Addendum)
 Routine HCM labs reviewed -patient has had these completed separately.  Most recent labs only included CMP and CBC.  No concerns with these labs.  Would recommend for cholesterol panel to be checked with next lab draw. HCM reviewed/discussed. Anticipatory guidance regarding healthy weight, lifestyle and choices given. Recommend healthy diet.  Recommend approximately 150 minutes/week of moderate intensity exercise Recommend regular dental and vision exams Always use seatbelt/lap and shoulder restraints Recommend using smoke alarms and checking batteries at least twice a year Recommend using sunscreen when outside Discussed immunization recommendations

## 2024-08-16 NOTE — Assessment & Plan Note (Signed)
 Blood pressure elevated in office today and was elevated at last office visit as well.  He reports that he does check his blood pressure at home and that typically at home is within normal range.  Denies any issues with headaches or chest pain. We discussed considerations, he we will continue with monitoring blood pressure at home, advised for him to send us  a MyChart message with recent readings sometime the next few weeks or so.  Would recommend continue to monitor blood pressure closely given elevation seen here in the office

## 2024-09-22 ENCOUNTER — Telehealth: Admitting: Physician Assistant

## 2024-09-22 DIAGNOSIS — B001 Herpesviral vesicular dermatitis: Secondary | ICD-10-CM

## 2024-09-22 MED ORDER — VALACYCLOVIR HCL 1 G PO TABS: 2000.0000 mg | ORAL_TABLET | Freq: Two times a day (BID) | ORAL | 0 refills | Status: AC

## 2024-09-22 NOTE — Progress Notes (Signed)
 We are sorry that you are not feeling well.  Here is how we plan to help!  Based on what you have shared with me it does look like you have a viral infection.    Most cold sores or fever blisters are small fluid filled blisters around the mouth caused by herpes simplex virus.  The most common strain of the virus causing cold sores is herpes simplex virus 1.  It can be spread by skin contact, sharing eating utensils, or even sharing towels.  Cold sores are contagious to other people until dry. (Approximately 5-7 days).  Wash your hands. You can spread the virus to your eyes through handling your contact lenses after touching the lesions.  Most people experience pain at the sight or tingling sensations in their lips that may begin before the ulcers erupt.  Herpes simplex is treatable but not curable.  It may lie dormant for a long time and then reappear due to stress or prolonged sun exposure.  Many patients have success in treating their cold sores with an over the counter topical called Abreva.  You may apply the cream up to 5 times daily (maximum 10 days) until healing occurs.  If you would like to use an oral antiviral medication to speed the healing of your cold sore, I have sent a prescription to your local pharmacy Valacyclovir  2 gm take one by mouth twice a day for 1 day    HOME CARE:  Wash your hands frequently. Do not pick at or rub the sore. Don't open the blisters. Avoid kissing other people during this time. Avoid sharing drinking glasses, eating utensils, or razors. Do not handle contact lenses unless you have thoroughly washed your hands with soap and warm water ! Avoid oral sex during this time.  Herpes from sores on your mouth can spread to your partner's genital area. Avoid contact with anyone who has eczema or a weakened immune system. Cold sores are often triggered by exposure to intense sunlight, use a lip balm containing a sunscreen (SPF 30 or higher).  GET HELP RIGHT AWAY  IF:  Blisters look infected. Blisters occur near or in the eye. Symptoms last longer than 10 days. Your symptoms become worse.  MAKE SURE YOU:  Understand these instructions. Will watch your condition. Will get help right away if you are not doing well or get worse.    Your e-visit answers were reviewed by a board certified advanced clinical practitioner to complete your personal care plan.  Depending upon the condition, your plan could have  Included both over the counter or prescription medications.    Please review your pharmacy choice.  Be sure that the pharmacy you have chosen is open so that you can pick up your prescription now.  If there is a problem you can message your provider in MyChart to have the prescription routed to another pharmacy.    Your safety is important to us .  If you have drug allergies check our prescription carefully.  For the next 24 hours you can use MyChart to ask questions about today's visit, request a non-urgent call back, or ask for a work or school excuse from your e-visit provider.  You will get an email in the next two days asking about your experience.  I hope that your e-visit has been valuable and will speed your recovery.   I have spent 5 minutes in review of e-visit questionnaire, review and updating patient chart, medical decision making and response to patient.  Fayelynn Distel, FNP

## 2024-10-21 ENCOUNTER — Telehealth

## 2024-10-21 DIAGNOSIS — J340 Abscess, furuncle and carbuncle of nose: Secondary | ICD-10-CM | POA: Diagnosis not present

## 2024-10-21 MED ORDER — SULFAMETHOXAZOLE-TRIMETHOPRIM 800-160 MG PO TABS: 1.0000 | ORAL_TABLET | Freq: Two times a day (BID) | ORAL | 0 refills | Status: DC

## 2024-10-21 MED ORDER — MUPIROCIN 2 % EX OINT: 1.0000 | TOPICAL_OINTMENT | Freq: Two times a day (BID) | CUTANEOUS | 0 refills | Status: DC

## 2024-10-21 NOTE — Progress Notes (Signed)
 " Virtual Visit Consent   Douglas Sanford, you are scheduled for a virtual visit with a [facility] provider today. Just as with appointments in the office, your consent must be obtained to participate. Your consent will be active for this visit and any virtual visit you may have with one of our providers in the next 365 days. If you have a MyChart account, a copy of this consent can be sent to you electronically.  As this is a virtual visit, video technology does not allow for your provider to perform a traditional examination. This may limit your provider's ability to fully assess your condition. If your provider identifies any concerns that need to be evaluated in person or the need to arrange testing (such as labs, EKG, etc.), we will make arrangements to do so. Although advances in technology are sophisticated, we cannot ensure that it will always work on either your end or our end. If the connection with a video visit is poor, the visit may have to be switched to a telephone visit. With either a video or telephone visit, we are not always able to ensure that we have a secure connection.  By engaging in this virtual visit, you consent to the provision of healthcare and authorize for your insurance to be billed (if applicable) for the services provided during this visit. Depending on your insurance coverage, you may receive a charge related to this service.  I need to obtain your verbal consent now. Are you willing to proceed with your visit today? Douglas Sanford has provided verbal consent on 10/21/2024 for a virtual visit (video or telephone). Douglas LELON Servant, NP  Date: 10/21/2024 9:40 AM   Virtual Visit via Video Note   I, Douglas Sanford, connected with  Douglas Sanford  ([id number], August 17, 1981) on 10/21/2024 at  9:15 AM EST by a video-enabled telemedicine application and verified that I am speaking with the correct person using two identifiers.  Location: Patient: Virtual Visit Location Patient:  Home Provider: Virtual Visit Location Provider: Home Office   I discussed the limitations of evaluation and management by telemedicine and the availability of in person appointments. The patient expressed understanding and agreed to proceed.    History of Present Illness: Douglas Sanford is a 44 y.o. who identifies as a male who was assigned male at birth, and is being seen today for cellulitis.   [name] had a small bump on the inside of his nose which he attempted to remove by popping.  Since then the area has been draining pus and the left side of his nose is now red, tender and swollen.    Problems:  Patient Active Problem List   Diagnosis Date Noted   Elevated blood pressure reading in office without diagnosis of hypertension 08/16/2024   Acute bilateral low back pain without sciatica 05/07/2022   Screening for endocrine, nutritional, metabolic and immunity disorder 08/09/2021   Screening for lipid disorders 08/09/2021   Screening for deficiency anemia 08/09/2021   Wellness examination 08/09/2021    Allergies: Allergies[1] Medications: Current Medications[2]  Observations/Objective: Patient is well-developed, well-nourished in no acute distress.  Resting comfortably at home.  Head is normocephalic, atraumatic.  No labored breathing.  Speech is clear and coherent with logical content.  Patient is alert and oriented at baseline.  Moderate erythema of the left nose and nares  Assessment and Plan: 1. Cellulitis of nose (Primary) - mupirocin  ointment (BACTROBAN ) 2 %; Apply 1 Application topically 2 (two)  times daily.  Dispense: 30 g; Refill: 0 - sulfamethoxazole -trimethoprim  (BACTRIM  DS) 800-160 MG tablet; Take 1 tablet by mouth 2 (two) times daily for 7 days.  Dispense: 14 tablet; Refill: 0   Follow Up Instructions: I discussed the assessment and treatment plan with the patient. The patient was provided an opportunity to ask questions and all were answered. The patient  agreed with the plan and demonstrated an understanding of the instructions.  A copy of instructions were sent to the patient via MyChart unless otherwise noted below.    The patient was advised to call back or seek an in-person evaluation if the symptoms worsen or if the condition fails to improve as anticipated.    Douglas LELON Servant, NP     [1] No Known Allergies [2]  Current Outpatient Medications:    mupirocin  ointment (BACTROBAN ) 2 %, Apply 1 Application topically 2 (two) times daily., Disp: 30 g, Rfl: 0   sulfamethoxazole -trimethoprim  (BACTRIM  DS) 800-160 MG tablet, Take 1 tablet by mouth 2 (two) times daily for 7 days., Disp: 14 tablet, Rfl: 0   anastrozole (ARIMIDEX) 1 MG tablet, Take 1 mg by mouth daily., Disp: , Rfl:    Testosterone  Cypionate 200 MG/ML SOLN, , Disp: , Rfl:   "

## 2024-10-21 NOTE — Patient Instructions (Addendum)
" °  Douglas Sanford, thank you for joining Haze LELON Servant, NP for today's virtual visit.  While this provider is not your primary care provider (PCP), if your PCP is located in our provider database this encounter information will be shared with them immediately following your visit.   A [facility] MyChart account gives you access to today's visit and all your visits, tests, and labs performed at [facility]  click here if you don't have a [facility] MyChart account or go to [email or web address]  Consent: (Patient) Douglas Sanford provided verbal consent for this virtual visit at the beginning of the encounter.  Current Medications:  Current Outpatient Medications:    mupirocin  ointment (BACTROBAN ) 2 %, Apply 1 Application topically 2 (two) times daily., Disp: 30 g, Rfl: 0   sulfamethoxazole -trimethoprim  (BACTRIM  DS) 800-160 MG tablet, Take 1 tablet by mouth 2 (two) times daily for 7 days., Disp: 14 tablet, Rfl: 0   anastrozole (ARIMIDEX) 1 MG tablet, Take 1 mg by mouth daily., Disp: , Rfl:    Testosterone  Cypionate 200 MG/ML SOLN, , Disp: , Rfl:    Medications ordered in this encounter:  Meds ordered this encounter  Medications   mupirocin  ointment (BACTROBAN ) 2 %    Sig: Apply 1 Application topically 2 (two) times daily.    Dispense:  30 g    Refill:  0    Supervising Provider:   BLAISE ALEENE KIDD [[id number]]   sulfamethoxazole -trimethoprim  (BACTRIM  DS) 800-160 MG tablet    Sig: Take 1 tablet by mouth 2 (two) times daily for 7 days.    Dispense:  14 tablet    Refill:  0    Supervising Provider:   BLAISE ALEENE KIDD [[id number]]     *If you need refills on other medications prior to your next appointment, please contact your pharmacy*  Follow-Up: Call back or seek an in-person evaluation if the symptoms worsen or if the condition fails to improve as anticipated.  [facility] [phone]  Other Instructions   If you have been instructed to have an  in-person evaluation today at a local Urgent Care facility, please use the link below. It will take you to a list of all of our available [facility], including address, phone number and hours of operation. Please do not delay care.  [facility]  If you or a family member do not have a primary care provider, use the link below to schedule a visit and establish care. When you choose a [facility] primary care physician or advanced practice provider, you gain a long-term partner in health. Find a Primary Care Provider  Learn more about [facility]'s in-office and virtual care options: [facility] - Get Care Now  "

## 2024-10-24 ENCOUNTER — Telehealth: Admitting: Family Medicine

## 2024-10-24 DIAGNOSIS — J340 Abscess, furuncle and carbuncle of nose: Secondary | ICD-10-CM

## 2024-10-24 NOTE — Progress Notes (Signed)
" °  This definitely sounds like it is worsening, and might actually need a I&D in person. We are going to recommend in person eval and assessment with Urgent Care.  You might benefit from a med change, but given the need to look and make sure there is nothing that needs to be drained, you need to be seen in person.     NOTE: There will be NO CHARGE for this E-Visit   If you are having a true medical emergency, please call 911.   "

## 2024-10-25 ENCOUNTER — Ambulatory Visit
Admission: RE | Admit: 2024-10-25 | Discharge: 2024-10-25 | Disposition: A | Discharge status: Home / Self Care | Payer: Self-pay | Attending: Emergency Medicine | Admitting: Emergency Medicine

## 2024-10-25 VITALS — BP 163/108 | HR 100 | Temp 98.1°F | Resp 18

## 2024-10-25 DIAGNOSIS — J340 Abscess, furuncle and carbuncle of nose: Secondary | ICD-10-CM

## 2024-10-25 DIAGNOSIS — R030 Elevated blood-pressure reading, without diagnosis of hypertension: Secondary | ICD-10-CM

## 2024-10-25 MED ORDER — DOXYCYCLINE HYCLATE 100 MG PO CAPS: 100.0000 mg | ORAL_CAPSULE | Freq: Two times a day (BID) | ORAL | 0 refills | Status: AC

## 2024-10-25 NOTE — Discharge Instructions (Addendum)
 Take the doxycycline  as directed.  Stop taking the Bactrim .  Continue using the mupirocin  ointment as directed.    Follow-up with your primary care provider for a recheck in 2 days.  Go to the emergency department if you have worsening symptoms.    Your blood pressure is elevated today at 163/108.  Please have this rechecked by your primary care provider.

## 2024-10-25 NOTE — ED Provider Notes (Signed)
 " Douglas Sanford    CSN: [id number] Arrival date & time: 10/25/24  1155      History   Chief Complaint Chief Complaint  Patient presents with   Abscess    Possible abscess on left nose. Started Bactrim  4 days ago. Not much noticed change in symptoms. Pain has eased some. Redness remains pretty much the same and now able to get bloody pus from the inside of my left nostril when sight is squeezed. - Entered by patient    HPI Douglas Sanford is a 44 y.o. male.  Patient presents with 5-day history of an abscess in his left nostril.  The area has opened and been draining pus.  His nose is red and tender and swollen; this is improved today.  He is currently on Bactrim  and has 2 days left of a 7-day course.  No fever or chills.  Patient had a telehealth visit on 10/21/2024; diagnosed with cellulitis of nose; treated with Bactrim  and mupirocin  ointment.  He had another telehealth visit yesterday for his symptoms and was instructed to be seen in person.  The history is provided by the patient and medical records.    Past Medical History:  Diagnosis Date   Low back pain     Patient Active Problem List   Diagnosis Date Noted   Elevated blood pressure reading in office without diagnosis of hypertension 08/16/2024   Acute bilateral low back pain without sciatica 05/07/2022   Screening for endocrine, nutritional, metabolic and immunity disorder 08/09/2021   Screening for lipid disorders 08/09/2021   Screening for deficiency anemia 08/09/2021   Wellness examination 08/09/2021    Past Surgical History:  Procedure Laterality Date   BACK SURGERY     SPINE SURGERY     x 3 with fusion L-4/L-5       Home Medications    Prior to Admission medications  Medication Sig Start Date End Date Taking? Authorizing Provider  doxycycline  (VIBRAMYCIN ) 100 MG capsule Take 1 capsule (100 mg total) by mouth 2 (two) times daily for 7 days. 10/25/24 11/01/24 Yes Corlis Burnard DEL, NP  anastrozole  (ARIMIDEX) 1 MG tablet Take 1 mg by mouth daily.    [provider]  mupirocin  ointment (BACTROBAN ) 2 % Apply 1 Application topically 2 (two) times daily. 10/21/24   Theotis Haze ORN, NP  Testosterone  Cypionate 200 MG/ML SOLN  10/12/21   [provider]    Family History Family History  Problem Relation Age of Onset   Diabetes Mother    Diabetes Father    Hearing loss Father    Stroke Father    Vision loss Father    Heart disease Paternal Grandfather     Social History Social History[1]   Allergies   Patient has no known allergies.   Review of Systems Review of Systems  Constitutional:  Negative for chills and fever.  HENT:         Abscess inside left nostril.  Respiratory:  Negative for cough and shortness of breath.   Skin:  Positive for color change and wound.     Physical Exam Triage Vital Signs ED Triage Vitals  Encounter Vitals Group     BP 10/25/24 1155 (!) 163/108     Girls Systolic BP Percentile --      Girls Diastolic BP Percentile --      Boys Systolic BP Percentile --      Boys Diastolic BP Percentile --      Pulse  Rate 10/25/24 1155 100     Resp 10/25/24 1155 18     Temp 10/25/24 1155 98.1 F (36.7 C)     Temp src --      SpO2 10/25/24 1155 97 %     Weight --      Height --      Head Circumference --      Peak Flow --      Pain Score 10/25/24 1219 2     Pain Loc --      Pain Education --      Exclude from Growth Chart --    No data found.  Updated Vital Signs BP (!) 163/108   Pulse 100   Temp 98.1 F (36.7 C)   Resp 18   SpO2 97%   Visual Acuity Right Eye Distance:   Left Eye Distance:   Bilateral Distance:    Right Eye Near:   Left Eye Near:    Bilateral Near:     Physical Exam Constitutional:      General: He is not in acute distress. HENT:     Nose:     Comments: Left nostril has small tender abscess with purulent drainage.  Left external nose moderately erythematous and mildly edematous.     Mouth/Throat:     Mouth: Mucous membranes are moist.  Cardiovascular:     Rate and Rhythm: Normal rate.  Pulmonary:     Effort: Pulmonary effort is normal. No respiratory distress.  Neurological:     Mental Status: He is alert.      UC Treatments / Results  Labs (all labs ordered are listed, but only abnormal results are displayed) Labs Reviewed - No data to display  EKG   Radiology No results found.  Procedures Procedures (including critical care time)  Medications Ordered in UC Medications - No data to display  Initial Impression / Assessment and Plan / UC Course  I have reviewed the triage vital signs and the nursing notes.  Pertinent labs & imaging results that were available during my care of the patient were reviewed by me and considered in my medical decision making (see chart for details).    Abscess of nasal cavity, elevated blood pressure reading.  Afebrile.  Patient is currently on Bactrim .  The abscess in his left nose is now open spontaneously and draining.  He reports improvement of the redness and swelling today.  No I&D indicated at this time as the abscess is already draining.  Changing antibiotic today to doxycycline .  Encouraged him to continue using the mupirocin  ointment.  ED precautions given.  Education provided on nasal abscess.  Instructed him to follow-up with his PCP in 2 days for recheck.  Also discussed with patient that his blood pressure is elevated today.  He states he has whitecoat syndrome and that his blood pressure is always elevated when he is at the doctor's office.  He reports normal blood pressures when he monitors it at home and at work.  Instructed him to follow-up with his PCP about his blood pressure and education provided on monitoring blood pressure at home.  He agrees to plan of care. Final Clinical Impressions(s) / UC Diagnoses   Final diagnoses:  Abscess of nasal cavity  Elevated blood pressure reading     Discharge  Instructions      Take the doxycycline  as directed.  Stop taking the Bactrim .  Continue using the mupirocin  ointment as directed.    Follow-up with your primary  care provider for a recheck in 2 days.  Go to the emergency department if you have worsening symptoms.    Your blood pressure is elevated today at 163/108.  Please have this rechecked by your primary care provider.          ED Prescriptions     Medication Sig Dispense Auth. Provider   doxycycline  (VIBRAMYCIN ) 100 MG capsule Take 1 capsule (100 mg total) by mouth 2 (two) times daily for 7 days. 14 capsule Corlis Burnard DEL, NP      PDMP not reviewed this encounter.    [1]  Social History Tobacco Use   Smoking status: Never    Passive exposure: Never   Smokeless tobacco: Never  Vaping Use   Vaping status: Never Used  Substance Use Topics   Alcohol use: Not Currently   Drug use: Never     Corlis Burnard DEL, NP 10/25/24 1256  "

## 2024-10-25 NOTE — ED Triage Notes (Signed)
 Patient to Urgent Care with complaints of an abscess to his left nostril. No fevers.  Completed televisit on Saturday- taking bactrim . Has been able to drain the area over the last two days. Has two days left of medication.

## 2024-11-11 ENCOUNTER — Telehealth: Admitting: Family Medicine

## 2024-11-11 ENCOUNTER — Other Ambulatory Visit: Payer: Self-pay

## 2024-11-11 DIAGNOSIS — H1033 Unspecified acute conjunctivitis, bilateral: Secondary | ICD-10-CM | POA: Diagnosis not present

## 2024-11-11 MED ORDER — POLYMYXIN B-TRIMETHOPRIM 10000-0.1 UNIT/ML-% OP SOLN
1.0000 [drp] | OPHTHALMIC | 0 refills | Status: DC
  Filled 2024-11-11: qty 10, 30d supply, fill #0

## 2024-11-11 NOTE — Patient Instructions (Signed)
 Eye Infection (Bacterial Conjunctivitis) in Adults: What to Know Bacterial conjunctivitis is an infection and swelling of the clear membrane that covers the white part of your eye and the inner part of your eyelid (conjunctiva). This infection can make your eye: Red or pink. Itchy or irritated. Depending on the type of germ (bacteria), this infection can be contagious, which means it spreads easily from person to person. It can also spread from one eye to the other eye. What are the causes? This condition is caused by germs. You may get the infection if you come into close contact with: A person who has the infection. Things that have germs on them (are contaminated), such as towels, pillow cases. contact lens solution, or eye makeup. What increases the risk? Having contact with people who have the infection. Wearing contact lenses. Having a sinus infection. Having had a recent eye injury or surgery. Having a weak body defense system (immune system). Having dry eyes. What are the signs or symptoms?  Thick, yellowish or pus coming from the eye or eyes. Eyelids that stick together because of the pus or crusts. Pink or red eye or eyes. Sore or painful eyes, or a burning feeling in the eyes. Tearing or watery eyes. Itchy eyes. Swollen eyelids. Other symptoms may include: Feeling like something is stuck in the eyes. Blurry vision. Having an ear infection at the same time. How is this diagnosed? This condition is diagnosed based on: Your symptoms and medical history. An eye exam. Testing a sample of discharge or pus from your eye. This is rarely done. How is this treated?  Using antibiotic medicines. These may be: Eye drops or ointments to clear the infection quickly and to prevent the spread of the infection to others. Pills or liquid medicine taken by mouth (orally). Oral medicine may be used to treat infections that do not respond to drops or ointments, or infections that last  longer than 10 days. Placing cool, wet cloths on your eyes. Follow these instructions at home: Medicines Take your medicines only as told. If you were given antibiotics, take them as told. Do not stop taking them even if you start to feel better, unless told by your provider. If you were given antibiotic drops or ointment, use it for the time you were told. Do not stop using it sooner even if you start to feel better. Do not touch your eyelid with the eye drop bottle or the ointment tube. Managing discomfort Wipe any fluid from your eye with a warm, wet washcloth or a cotton ball. Wash your hands with soap and water for at least 20 seconds before and after doing this. If you can't use soap and water, use hand sanitizer. Put a clean, cool, wet cloth on your eye. Do this for 10-20 minutes, 3-4 times a day. Preventing the infection from spreading Do not share towels or washcloths. Do not share eye makeup, makeup brushes, contact lenses, or glasses. Wash your hands often with soap and water for at least 20 seconds and especially before touching your face or eyes. Use paper towels to dry your hands. If you can't use soap and water, use hand sanitizer. Avoid contact with other people while you have symptoms, or as long as told by your provider. General instructions If you wear contact lenses, do not wear them until the inflammation is gone and your provider says it is safe to wear them again. Wear glasses until your provider says it is OK to wear contact  lenses again. Ask your provider how to sterilize or replace your contact lenses before you use them again. Do not wear eye makeup until the infection is gone. Throw away old eye makeup. Change or wash your pillowcase every day. Do not touch or rub your eyes. Do not drive or use machines if your vision is blurry. Contact a health care provider if: You have a fever. You don't get better after 10 days. Your vision becomes suddenly blurry. Get  help right away if: You have very bad pain when you move your eye. You have facial pain, redness, or swelling. You have sudden loss of vision. These symptoms may be an emergency. Call 911 right away. Do not wait to see if the symptoms will go away. Do not drive yourself to the hospital. This information is not intended to replace advice given to you by your health care provider. Make sure you discuss any questions you have with your health care provider. Document Revised: 12/05/2023 Document Reviewed: 12/05/2023 Elsevier Patient Education  2025 [employer].

## 2024-11-11 NOTE — Progress Notes (Signed)
 " Virtual Visit Consent   Douglas Sanford, you are scheduled for a virtual visit with a  provider today. Just as with appointments in the office, your consent must be obtained to participate. Your consent will be active for this visit and any virtual visit you may have with one of our providers in the next 365 days. If you have a MyChart account, a copy of this consent can be sent to you electronically.  As this is a virtual visit, video technology does not allow for your provider to perform a traditional examination. This may limit your provider's ability to fully assess your condition. If your provider identifies any concerns that need to be evaluated in person or the need to arrange testing (such as labs, EKG, etc.), we will make arrangements to do so. Although advances in technology are sophisticated, we cannot ensure that it will always work on either your end or our end. If the connection with a video visit is poor, the visit may have to be switched to a telephone visit. With either a video or telephone visit, we are not always able to ensure that we have a secure connection.  By engaging in this virtual visit, you consent to the provision of healthcare and authorize for your insurance to be billed (if applicable) for the services provided during this visit. Depending on your insurance coverage, you may receive a charge related to this service.  I need to obtain your verbal consent now. Are you willing to proceed with your visit today? Douglas Sanford has provided verbal consent on 11/11/2024 for a virtual visit (video or telephone). Loa Lamp, FNP  Date: 11/11/2024 9:06 AM   Virtual Visit via Video Note   I, Loa Lamp, connected with  Douglas Sanford  ([id number], 09/05/1981) on 11/11/24 at  9:00 AM EST by a video-enabled telemedicine application and verified that I am speaking with the correct person using two identifiers.  Location: Patient: Virtual Visit Location Patient:  Home Provider: Virtual Visit Location Provider: Home Office   I discussed the limitations of evaluation and management by telemedicine and the availability of in person appointments. The patient expressed understanding and agreed to proceed.    History of Present Illness: Douglas Sanford is a 44 y.o. who identifies as a male who was assigned male at birth, and is being seen today for redness, drainage, crusting both eyes with children in home with conjunctivitis. Started last night. History of this each year. SABRA  HPI: HPI  Problems:  Patient Active Problem List   Diagnosis Date Noted   Elevated blood pressure reading in office without diagnosis of hypertension 08/16/2024   Acute bilateral low back pain without sciatica 05/07/2022   Screening for endocrine, nutritional, metabolic and immunity disorder 08/09/2021   Screening for lipid disorders 08/09/2021   Screening for deficiency anemia 08/09/2021   Wellness examination 08/09/2021    Allergies: Allergies[1] Medications: Current Medications[2]  Observations/Objective: Patient is well-developed, well-nourished in no acute distress.  Resting comfortably  at home.  Head is normocephalic, atraumatic.  No labored breathing.  Speech is clear and coherent with logical content.  Patient is alert and oriented at baseline.    Assessment and Plan: 1. Acute bacterial conjunctivitis of both eyes (Primary)  Ways to prevent transmission discussed, ophthalmology if sx worsen.   Follow Up Instructions: I discussed the assessment and treatment plan with the patient. The patient was provided an opportunity to ask questions and all were answered. The patient  agreed with the plan and demonstrated an understanding of the instructions.  A copy of instructions were sent to the patient via MyChart unless otherwise noted below.     The patient was advised to call back or seek an in-person evaluation if the symptoms worsen or if the condition fails to  improve as anticipated.    Xin Klawitter, FNP     [1] No Known Allergies [2]  Current Outpatient Medications:    trimethoprim -polymyxin b  (POLYTRIM ) ophthalmic solution, Place 1 drop into both eyes every 4 (four) hours for 7 days., Disp: 10 mL, Rfl: 0   anastrozole (ARIMIDEX) 1 MG tablet, Take 1 mg by mouth daily., Disp: , Rfl:    mupirocin  ointment (BACTROBAN ) 2 %, Apply 1 Application topically 2 (two) times daily., Disp: 30 g, Rfl: 0   Testosterone  Cypionate 200 MG/ML SOLN, , Disp: , Rfl:   "

## 2024-11-15 ENCOUNTER — Telehealth: Admitting: Physician Assistant

## 2024-11-15 ENCOUNTER — Other Ambulatory Visit: Payer: Self-pay

## 2024-11-15 DIAGNOSIS — B001 Herpesviral vesicular dermatitis: Secondary | ICD-10-CM

## 2024-11-15 MED ORDER — VALACYCLOVIR HCL 1 G PO TABS
2000.0000 mg | ORAL_TABLET | Freq: Two times a day (BID) | ORAL | 0 refills | Status: AC
  Filled 2024-11-15: qty 4, 1d supply, fill #0

## 2024-11-15 NOTE — Progress Notes (Signed)
 We are sorry that you are not feeling well.  Here is how we plan to help!  Based on your symptoms, it appears you may have a viral infection.   Cold sores, also known as fever blisters, are small, fluid-filled blisters that typically appear around the mouth. They are caused by the herpes simplex virus, most commonly herpes simplex virus type 1 (HSV-1). The virus spreads through direct skin contact, as well as by sharing items like eating utensils, lip balms, or towels.   Cold sores are contagious until they dry out, which usually takes about 5 to 7 days. Its important to wash your hands frequently, especially after touching the affected area. If you wear contact lenses, avoid handling them after touching a cold sore, as the virus can spread to your eyes and cause complications.   Most people experience pain at the site or tingling sensations in their lips that may begin before the ulcers erupt.  Herpes simplex is treatable but not curable.  It may lie dormant for a long time and then reappear due to stress or prolonged sun exposure.  Many patients have success in treating their cold sores with an over the counter topical called Abreva.  You may apply the cream up to 5 times daily (maximum 10 days) until healing occurs.  To help speed the healing of your cold sore, I have prescribed Valacyclovir  1000 mg -- Take two pills by mouth twice a day for 1 day    HOME CARE:  Wash your hands frequently. Do not pick at or rub the sore. Don't pop the blisters. Avoid kissing other people during this time. Avoid sharing drinking glasses, eating utensils, or razors. Do not handle contact lenses unless you have thoroughly washed your hands with soap and warm water! Avoid oral sex during this time.  Herpes from sores on your mouth can spread to your partner's genital area. Avoid contact with anyone who has eczema or a weakened immune system. Cold sores are often triggered by exposure to intense sunlight,  use a lip balm containing a sunscreen (SPF 30 or higher).  GET HELP RIGHT AWAY IF:  Blisters look infected -- increased redness around the site, warmth of skin, drainage of pus from around the area. Blisters occur near or in the eye. Symptoms last longer than 10 days. Your symptoms worsen.  MAKE SURE YOU:  Understand these instructions. Will watch your condition. Will get help right away if you are not doing well or get worse.  Your e-visit answers were reviewed by a board certified advanced clinical practitioner to complete your personal care plan.  Depending upon the condition, your plan could have included both over the counter or prescription medications.     Please review your pharmacy choice.  Be sure that the pharmacy you have chosen is open so that you can pick up your prescription now.  If there is a problem, you can message your provider in MyChart to have the prescription routed to another pharmacy.     Your safety is important to us .  If you have drug allergies, check our prescription carefully.   For the next 24 hours you can use MyChart to ask questions about today's visit, request a non-urgent call back, or ask for a work or school excuse from your e-visit provider.   You will receive an email in the next two days asking about your experience.  I hope that your e-visit has been valuable and will speed up your recovery.  I have spent 5 minutes in review of e-visit questionnaire, review and updating patient chart, medical decision making and response to patient.   Delon CHRISTELLA Dickinson, PA-C

## 2024-11-16 ENCOUNTER — Other Ambulatory Visit: Payer: Self-pay

## 2025-08-20 ENCOUNTER — Encounter (HOSPITAL_BASED_OUTPATIENT_CLINIC_OR_DEPARTMENT_OTHER): Admitting: Family Medicine
